# Patient Record
Sex: Male | Born: 1975 | Race: Black or African American | Hispanic: No | Marital: Married | Smoking: Never smoker
Health system: Southern US, Community
[De-identification: ages and names within clinical notes are randomized; demographics above are authoritative.]

## PROBLEM LIST (undated history)

## (undated) DIAGNOSIS — J45909 Unspecified asthma, uncomplicated: Secondary | ICD-10-CM

## (undated) DIAGNOSIS — T7840XA Allergy, unspecified, initial encounter: Secondary | ICD-10-CM

## (undated) DIAGNOSIS — M1990 Unspecified osteoarthritis, unspecified site: Secondary | ICD-10-CM

## (undated) DIAGNOSIS — I10 Essential (primary) hypertension: Secondary | ICD-10-CM

## (undated) HISTORY — DX: Essential (primary) hypertension: I10

## (undated) HISTORY — DX: Unspecified asthma, uncomplicated: J45.909

## (undated) HISTORY — DX: Allergy, unspecified, initial encounter: T78.40XA

## (undated) HISTORY — DX: Unspecified osteoarthritis, unspecified site: M19.90

---

## 2015-02-11 ENCOUNTER — Encounter: Payer: Self-pay | Admitting: Primary Care

## 2015-02-11 ENCOUNTER — Other Ambulatory Visit: Payer: Self-pay | Admitting: Primary Care

## 2015-02-11 ENCOUNTER — Ambulatory Visit (INDEPENDENT_AMBULATORY_CARE_PROVIDER_SITE_OTHER): Payer: BLUE CROSS/BLUE SHIELD | Admitting: Primary Care

## 2015-02-11 VITALS — BP 200/108 | HR 78 | Temp 98.0°F | Ht 74.0 in | Wt 226.8 lb

## 2015-02-11 DIAGNOSIS — I10 Essential (primary) hypertension: Secondary | ICD-10-CM

## 2015-02-11 MED ORDER — LISINOPRIL 5 MG PO TABS: 5.0000 mg | ORAL_TABLET | Freq: Every day | ORAL | Status: DC

## 2015-02-11 MED ORDER — CLONIDINE HCL 0.1 MG PO TABS: ORAL_TABLET | ORAL | Status: DC

## 2015-02-11 MED ORDER — AMLODIPINE BESYLATE 5 MG PO TABS: 5.0000 mg | ORAL_TABLET | Freq: Every day | ORAL | Status: DC

## 2015-02-11 NOTE — Assessment & Plan Note (Addendum)
Uncontrolled.  BP initially today 230/110. After 0.1 mg of clonidine pressure reduced to 200/106 (45 min after medication), then to 200/108 after a second 0.1 mg of clonidine (45 min after medication). He was provided with a third 0.1 mg clonidine tablet upon discharge. I highly recommended he go to the ER immediately due to risk of stroke and other complications of hypertension. He refused. I also spoke with his wife and notified her of my recommendation. Patient still refused treatment in the ER. Refilled medications and will be closely monitoring him. He is to follow up tomorrow for a re-check and then again next week. Renal ultrasound also ordered for further evaluation. Denies chest pain and shortness of breath.

## 2015-02-11 NOTE — Patient Instructions (Addendum)
I highly recommend you go to the Emergency Department now. Start your blood pressure medication today. I have sent refills to your pharmacy.  [facility].  Follow up tomorrow for a nurse visit and next Thursday with me for re-evaluation.   If you develop worsening headaches or chest pain please call me immediately and/or go to the emergency department.  It was a pleasure to meet you today! Please don't hesitate to call me with any questions. Welcome to [facility]!    Hypertension Hypertension, commonly called high blood pressure, is when the force of blood pumping through your arteries is too strong. Your arteries are the blood vessels that carry blood from your heart throughout your body. A blood pressure reading consists of a higher number over a lower number, such as 110/72. The higher number (systolic) is the pressure inside your arteries when your heart pumps. The lower number (diastolic) is the pressure inside your arteries when your heart relaxes. Ideally you want your blood pressure below 120/80. Hypertension forces your heart to work harder to pump blood. Your arteries may become narrow or stiff. Having hypertension puts you at risk for heart disease, stroke, and other problems.  RISK FACTORS Some risk factors for high blood pressure are controllable. Others are not.  Risk factors you cannot control include:   Race. You may be at higher risk if you are African American.  Age. Risk increases with age.  Gender. Men are at higher risk than women before age 37 years. After age 37, women are at higher risk than men. Risk factors you can control include:  Not getting enough exercise or physical activity.  Being overweight.  Getting too much fat, sugar, calories, or salt in your diet.  Drinking too much alcohol. SIGNS AND SYMPTOMS Hypertension does not usually cause signs or symptoms. Extremely high blood pressure (hypertensive  crisis) may cause headache, anxiety, shortness of breath, and nosebleed. DIAGNOSIS  To check if you have hypertension, your health care provider will measure your blood pressure while you are seated, with your arm held at the level of your heart. It should be measured at least twice using the same arm. Certain conditions can cause a difference in blood pressure between your right and left arms. A blood pressure reading that is higher than normal on one occasion does not mean that you need treatment. If one blood pressure reading is high, ask your health care provider about having it checked again. TREATMENT  Treating high blood pressure includes making lifestyle changes and possibly taking medicine. Living a healthy lifestyle can help lower high blood pressure. You may need to change some of your habits. Lifestyle changes may include:  Following the DASH diet. This diet is high in fruits, vegetables, and whole grains. It is low in salt, red meat, and added sugars.  Getting at least 2 hours of brisk physical activity every week.  Losing weight if necessary.  Not smoking.  Limiting alcoholic beverages.  Learning ways to reduce stress. If lifestyle changes are not enough to get your blood pressure under control, your health care provider may prescribe medicine. You may need to take more than one. Work closely with your health care provider to understand the risks and benefits. HOME CARE INSTRUCTIONS  Have your blood pressure rechecked as directed by your health care provider.   Take medicines only as directed by your health care provider. Follow the directions carefully. Blood pressure medicines must be  taken as prescribed. The medicine does not work as well when you skip doses. Skipping doses also puts you at risk for problems.   Do not smoke.   Monitor your blood pressure at home as directed by your health care provider. SEEK MEDICAL CARE IF:   You think you are having a reaction  to medicines taken.  You have recurrent headaches or feel dizzy.  You have swelling in your ankles.  You have trouble with your vision. SEEK IMMEDIATE MEDICAL CARE IF:  You develop a severe headache or confusion.  You have unusual weakness, numbness, or feel faint.  You have severe chest or abdominal pain.  You vomit repeatedly.  You have trouble breathing. MAKE SURE YOU:   Understand these instructions.  Will watch your condition.  Will get help right away if you are not doing well or get worse. Document Released: 07/18/2005 Document Revised: 12/02/2013 Document Reviewed: 05/10/2013 [facility] Patient Information 2015 [place], [place]. This information is not intended to replace advice given to you by your health care provider. Make sure you discuss any questions you have with your health care provider.

## 2015-02-11 NOTE — Progress Notes (Signed)
Subjective:    Patient ID: Maurice Henderson, male    DOB: 03/02/1976, 39 y.o.   MRN: [id number]  HPI  Maurice Henderson is a 39 year old male who presents today to establish care and discuss the problems mentioned below. Will obtain old records. He recently moved here from [place].   1) Hypertension: Diagnosed in January of 2016 when complaining of headaches. He was managed on amlodipine 5 mg and lisinopril 5 mg prior and has been out of his medication for one month since moving from [place]. When on these medications his blood pressure would run 110/80. He reports headaches, blurred vision over the past several weeks. He was evaluated at an optometrist yesterday. Denies chest pain, shortness of breath, and reports to be feeling well overall.  Review of Systems  Constitutional: Negative for unexpected weight change.  HENT: Negative for rhinorrhea.   Respiratory: Negative for cough and shortness of breath.   Cardiovascular: Negative for chest pain.  Gastrointestinal: Negative for diarrhea and constipation.  Genitourinary: Negative for difficulty urinating.  Musculoskeletal: Negative for myalgias and arthralgias.  Skin: Negative for rash.  Neurological: Positive for headaches. Negative for dizziness and numbness.  Psychiatric/Behavioral:       Denies concerns for anxiety or depression       Past Medical History  Diagnosis Date  . Allergy   . Arthritis   . Hypertension   . Asthma     during childhood    History   Social History  . Marital Status: Married    Spouse Name: N/A  . Number of Children: N/A  . Years of Education: N/A   Occupational History  . Not on file.   Social History Main Topics  . Smoking status: Never Smoker   . Smokeless tobacco: Not on file  . Alcohol Use: 0.0 oz/week    0 Standard drinks or equivalent per week     Comment: rarely  . Drug Use: Not on file  . Sexual Activity: Not on file   Other Topics Concern  . Not on file   Social History  Narrative   In a relationship.   3 children.   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   Moved to [place] from [place].   Enjoys going to the shooting range, paintball.     History reviewed. No pertinent past surgical history.  Family History  Problem Relation Age of Onset  . Arthritis Mother   . Arthritis Father   . Hyperlipidemia Father   . Hypertension Father   . Heart disease Father     No Known Allergies  No current outpatient prescriptions on file prior to visit.   No current facility-administered medications on file prior to visit.    BP 200/108 mmHg  Pulse 78  Temp(Src) 98 F (36.7 C) (Oral)  Ht 6\' 2"  (1.88 m)  Wt 226 lb 12.8 oz (102.876 kg)  BMI 29.11 kg/m2  SpO2 98%    Objective:   Physical Exam  Constitutional: He is oriented to person, place, and time. He appears well-nourished.  Eyes: Pupils are equal, round, and reactive to light.  Cardiovascular: Normal rate and regular rhythm.   Pulmonary/Chest: Effort normal and breath sounds normal.  Musculoskeletal: Normal range of motion.  Neurological: He is alert and oriented to person, place, and time.  Skin: Skin is warm and dry.  Psychiatric: He has a normal mood and affect.          Assessment & Plan:  Patient spent  over 2.5 hours in our office today due to treatment of extreme hypertension.

## 2015-02-11 NOTE — Progress Notes (Signed)
Pre visit review using our clinic review tool, if applicable. No additional management support is needed unless otherwise documented below in the visit note. 

## 2015-02-12 ENCOUNTER — Emergency Department: Payer: BLUE CROSS/BLUE SHIELD

## 2015-02-12 ENCOUNTER — Ambulatory Visit: Payer: BLUE CROSS/BLUE SHIELD

## 2015-02-12 ENCOUNTER — Emergency Department
Admission: EM | Admit: 2015-02-12 | Discharge: 2015-02-12 | Disposition: A | Discharge status: Home / Self Care | Payer: BLUE CROSS/BLUE SHIELD | Attending: Emergency Medicine | Admitting: Emergency Medicine

## 2015-02-12 ENCOUNTER — Other Ambulatory Visit: Payer: Self-pay

## 2015-02-12 DIAGNOSIS — R51 Headache: Secondary | ICD-10-CM | POA: Diagnosis not present

## 2015-02-12 DIAGNOSIS — R531 Weakness: Secondary | ICD-10-CM | POA: Diagnosis not present

## 2015-02-12 DIAGNOSIS — I10 Essential (primary) hypertension: Secondary | ICD-10-CM | POA: Insufficient documentation

## 2015-02-12 LAB — BASIC METABOLIC PANEL
Anion gap: 7 (ref 5–15)
BUN: 19 mg/dL (ref 6–20)
CO2: 25 mmol/L (ref 22–32)
Calcium: 9.4 mg/dL (ref 8.9–10.3)
Chloride: 103 mmol/L (ref 101–111)
Creatinine, Ser: 1.32 mg/dL — ABNORMAL HIGH (ref 0.61–1.24)
GFR calc Af Amer: >60 mL/min (ref >60)
GFR calc non Af Amer: >60 mL/min (ref >60)
Glucose, Bld: 145 mg/dL — ABNORMAL HIGH (ref 65–99)
POTASSIUM: 3.5 mmol/L (ref 3.5–5.1)
SODIUM: 135 mmol/L (ref 135–145)

## 2015-02-12 LAB — CBC
HEMATOCRIT: 41.6 % (ref 40.0–52.0)
Hemoglobin: 14.3 g/dL (ref 13.0–18.0)
MCH: 29.8 pg (ref 26.0–34.0)
MCHC: 34.4 g/dL (ref 32.0–36.0)
MCV: 86.7 fL (ref 80.0–100.0)
Platelets: 140 10*3/uL — ABNORMAL LOW (ref 150–440)
RBC: 4.8 MIL/uL (ref 4.40–5.90)
RDW: 12.8 % (ref 11.5–14.5)
WBC: 7.7 10*3/uL (ref 3.8–10.6)

## 2015-02-12 LAB — BRAIN NATRIURETIC PEPTIDE: B NATRIURETIC PEPTIDE 5: 54 pg/mL (ref 0.0–100.0)

## 2015-02-12 LAB — TROPONIN I

## 2015-02-12 NOTE — ED Provider Notes (Signed)
[facility] Emergency Department Provider Note     Time seen: ----------------------------------------- 2:16 PM on 02/12/2015 -----------------------------------------    I have reviewed the triage vital signs and the nursing notes.   HISTORY  Chief Complaint Hypertension    HPI Maurice Henderson is a 39 y.o. male who presents ER for high blood pressure. Patient states he was at work and was feeling a little weak and tired and saw his work Engineer, civil (consulting) and they checked his blood pressure it was markedly elevated. Patient just saw his primary care doctor yesterday to establish care and was placed on clonidine amlodipine and lisinopril. Patient states yesterday when he went to see the doctorhe was feeling fine. Currently feels fine, blood pressure noted to be elevated.   Past Medical History  Diagnosis Date  . Allergy   . Arthritis   . Hypertension   . Asthma     during childhood    Patient Active Problem List   Diagnosis Date Noted  . Essential hypertension 02/11/2015    No past surgical history on file.  Allergies Review of patient's allergies indicates no known allergies.  Social History History  Substance Use Topics  . Smoking status: Never Smoker   . Smokeless tobacco: Not on file  . Alcohol Use: 0.0 oz/week    0 Standard drinks or equivalent per week     Comment: rarely    Review of Systems Constitutional: Negative for fever. Eyes: Negative for visual changes. ENT: Negative for sore throat. Cardiovascular: Negative for chest pain. Respiratory: Negative for shortness of breath. Gastrointestinal: Negative for abdominal pain, vomiting and diarrhea. Genitourinary: Negative for dysuria. Musculoskeletal: Negative for back pain. Skin: Negative for rash. Neurological: Positive for weakness and headache that is gone now..  10-point ROS otherwise negative.  ____________________________________________   PHYSICAL EXAM:  VITAL  SIGNS: ED Triage Vitals  Enc Vitals Group     BP 02/12/15 1309 208/130 mmHg     Pulse Rate 02/12/15 1309 112     Resp --      Temp 02/12/15 1309 99 F (37.2 C)     Temp Source 02/12/15 1309 Oral     SpO2 02/12/15 1309 97 %     Weight 02/12/15 1309 226 lb (102.513 kg)     Height 02/12/15 1309  (1.88 m)     Head Cir --      Peak Flow --      Pain Score --      Pain Loc --      Pain Edu? --      Excl. in GC? --     Constitutional: Alert and oriented. Well appearing and in no distress. Eyes: Conjunctivae are normal. PERRL. Normal extraocular movements. ENT   Head: Normocephalic and atraumatic.   Nose: No congestion/rhinnorhea.   Mouth/Throat: Mucous membranes are moist.   Neck: No stridor. Hematological/Lymphatic/Immunilogical: No cervical lymphadenopathy. Cardiovascular: Normal rate, regular rhythm. Normal and symmetric distal pulses are present in all extremities. No murmurs, rubs, or gallops. Respiratory: Normal respiratory effort without tachypnea nor retractions. Breath sounds are clear and equal bilaterally. No wheezes/rales/rhonchi. Gastrointestinal: Soft and nontender. No distention. No abdominal bruits. There is no CVA tenderness. Musculoskeletal: Nontender with normal range of motion in all extremities. No joint effusions.  No lower extremity tenderness nor edema. Neurologic:  Normal speech and language. No gross focal neurologic deficits are appreciated. Speech is normal. No gait instability. Skin:  Skin is warm, dry and intact. No rash noted. Psychiatric: Mood and  affect are normal. Speech and behavior are normal. Patient exhibits appropriate insight and judgment. ____________________________________________  EKG: Interpreted by me. Normal sinus rhythm with a rate of 92, minimal voltage criteria for LVH, T-wave abnormalities, no evidence of acute infarction. QT interval is normal  ____________________________________________  ED COURSE:  Pertinent  labs & imaging results that were available during my care of the patient were reviewed by me and considered in my medical decision making (see chart for details). We'll check basic labs, reevaluate. Essentially patient with asymptomatic blood pressure ____________________________________________    LABS (pertinent positives/negatives)  Labs Reviewed  BASIC METABOLIC PANEL - Abnormal; Notable for the following:    Glucose, Bld 145 (*)    Creatinine, Ser 1.32 (*)    All other components within normal limits  CBC - Abnormal; Notable for the following:    Platelets 140 (*)    All other components within normal limits  TROPONIN I  BRAIN NATRIURETIC PEPTIDE    RADIOLOGY Images were viewed by me  Chest x-ray is unremarkable  ____________________________________________  FINAL ASSESSMENT AND PLAN  Hypertension  Plan: Patient likely was feeling poorly due to being started on 2 new blood pressure medications at the same time. He appears to be in no acute distress, labs reveal a slightly elevated creatinine which he can follow-up with his doctor for further reevaluation and recheck.   Emily FilbertWilliams, Jonathan E, MD   Emily FilbertJonathan E Williams, MD 02/12/15 [phone]

## 2015-02-12 NOTE — Discharge Instructions (Signed)

## 2015-02-12 NOTE — ED Notes (Signed)
Patient transported to X-ray 

## 2015-02-12 NOTE — ED Notes (Signed)
Pt arrives with HTN, pt states he was at work feeling a little "funky" and saw the work Engineer, civil (consulting)nurse, pt saw PCP yesterday and was restarted on his HTN medication, pt states headache

## 2015-02-19 ENCOUNTER — Encounter: Payer: Self-pay | Admitting: Primary Care

## 2015-02-19 ENCOUNTER — Ambulatory Visit (INDEPENDENT_AMBULATORY_CARE_PROVIDER_SITE_OTHER): Payer: BLUE CROSS/BLUE SHIELD | Admitting: Primary Care

## 2015-02-19 VITALS — BP 176/104 | HR 101 | Temp 98.0°F | Ht 74.0 in | Wt 225.1 lb

## 2015-02-19 DIAGNOSIS — E1165 Type 2 diabetes mellitus with hyperglycemia: Secondary | ICD-10-CM | POA: Insufficient documentation

## 2015-02-19 DIAGNOSIS — I10 Essential (primary) hypertension: Secondary | ICD-10-CM | POA: Diagnosis not present

## 2015-02-19 DIAGNOSIS — R0981 Nasal congestion: Secondary | ICD-10-CM | POA: Diagnosis not present

## 2015-02-19 DIAGNOSIS — R739 Hyperglycemia, unspecified: Secondary | ICD-10-CM | POA: Diagnosis not present

## 2015-02-19 DIAGNOSIS — E119 Type 2 diabetes mellitus without complications: Secondary | ICD-10-CM | POA: Insufficient documentation

## 2015-02-19 DIAGNOSIS — M542 Cervicalgia: Secondary | ICD-10-CM | POA: Diagnosis not present

## 2015-02-19 LAB — BASIC METABOLIC PANEL
BUN: 18 mg/dL (ref 6–23)
CHLORIDE: 101 meq/L (ref 96–112)
CO2: 31 meq/L (ref 19–32)
CREATININE: 1.47 mg/dL (ref 0.40–1.50)
Calcium: 9.8 mg/dL (ref 8.4–10.5)
GFR: 69.43 mL/min (ref >60.00)
Glucose, Bld: 107 mg/dL — ABNORMAL HIGH (ref 70–99)
POTASSIUM: 4.1 meq/L (ref 3.5–5.1)
SODIUM: 136 meq/L (ref 135–145)

## 2015-02-19 LAB — HEMOGLOBIN A1C: Hgb A1c MFr Bld: 5.3 % (ref 4.6–6.5)

## 2015-02-19 MED ORDER — FLUTICASONE PROPIONATE 50 MCG/ACT NA SUSP: 2.0000 | Freq: Every day | NASAL | Status: DC

## 2015-02-19 NOTE — Assessment & Plan Note (Signed)
Present for past several months. 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 Referral made to chiropractor per patient request. Stop ibuprofen use due to HTN and recent elevated creatine

## 2015-02-19 NOTE — Assessment & Plan Note (Signed)
Evaluated in ED on 7/14 and discharged home for follow up. Creatinine in ED elevated, will repeat today. Also glucose elevated, A1C today. BP overall reducing. Will continue to monitor. He is to purchase a cuff and measure daily. Headaches continue but are improved. Renal ultrasound tomorrow. Continue current meds. No changes in meds today as I do not want to bring his pressure down too quickly.  Follow up in 3 weeks. Will closely monitor.

## 2015-02-19 NOTE — Progress Notes (Signed)
Pre visit review using our clinic review tool, if applicable. No additional management support is needed unless otherwise documented below in the visit note. 

## 2015-02-19 NOTE — Progress Notes (Signed)
Subjective:    Patient ID: Maurice Henderson, male    DOB: 1976/05/27, 39 y.o.   MRN: [id number]  HPI  Maurice Henderson is a 39 year old male who presents today for follow up of hypertension. He was evaluated on 02/11/2015 as a new patient and was noted to have a blood pressure of 230/110 which decreaesd to 200/106 after a total of 0.3 mg of Clonodine. He had been out of his BP medications for one month since moving from [place]. He was taking Amlodipine  and Lisinopril  which was re-ordered.   He presented to the emergency department on 02/12/2015 due to feeling "off" and was evaluated. The EDP determined him to be stable for discharge and advised him to follow up as scheduled. Chest xray was unremarkable, BMP showed an elevated creatinine and glucose of 145.   Since last week his blood pressure has decreased. Today in the office he is running 176/104. Overall he's feeling better. He's been taking ibuprofen 600 mg BID consistently for the past several months for neck pain and stiffness.  2) Neck pain:Stiffness after work nearly everyday. 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 His stiffness has been present for the past several months and is not resolving. He's tried tiger balm and ibuprofen with some relief. He would like to be evaluated by a chiropractor and is requesting referral.  3) Nasal congestion: Present for the past 2 days with some sinus pressure. He endorses a history of sinusitis. Denies fevers, chills, fatigue. He's not taken anything OTC for his symptoms. He does working in a [employer] daily.  Review of Systems  HENT: Positive for congestion and sinus pressure. Negative for ear pain, rhinorrhea and sore throat.   Respiratory: Negative for shortness of breath.   Cardiovascular: Negative for chest pain.  Gastrointestinal: Negative for nausea.  Musculoskeletal: Negative for myalgias.    Neurological: Positive for headaches.       Past Medical History  Diagnosis Date  . Allergy   . Arthritis   . Hypertension   . Asthma     during childhood    History   Social History  . Marital Status: Married    Spouse Name: N/A  . Number of Children: N/A  . Years of Education: N/A   Occupational History  . Not on file.   Social History Main Topics  . Smoking status: Never Smoker   . Smokeless tobacco: Not on file  . Alcohol Use: 0.0 oz/week    0 Standard drinks or equivalent per week     Comment: rarely  . Drug Use: Not on file  . Sexual Activity: Not on file   Other Topics Concern  . Not on file   Social History Narrative   In a relationship.   3 children.   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   Moved to [place] from [place].   Enjoys going to the shooting range, paintball.     No past surgical history on file.  Family History  Problem Relation Age of Onset  . Arthritis Mother   . Arthritis Father   . Hyperlipidemia Father   . Hypertension Father   . Heart disease Father     No Known Allergies  Current Outpatient Prescriptions on File Prior to Visit  Medication Sig Dispense Refill  . amLODipine (NORVASC) 5 MG tablet Take 1 tablet (5 mg total) by mouth daily. 30  tablet 3  . lisinopril (PRINIVIL,ZESTRIL) 5 MG tablet Take 1 tablet (5 mg total) by mouth daily. 30 tablet 3   No current facility-administered medications on file prior to visit.    BP 176/104 mmHg  Pulse 101  Temp(Src) 98 F (36.7 C) (Oral)  Ht 6\' 2"  (1.88 m)  Wt 225 lb 1.9 oz (102.114 kg)  BMI 28.89 kg/m2  SpO2 98%    Objective:   Physical Exam  Constitutional: He appears well-nourished. He does not appear ill.  HENT:  Right Ear: Tympanic membrane and ear canal normal.  Left Ear: Tympanic membrane and ear canal normal.  Nose: Right sinus exhibits maxillary sinus tenderness. Right sinus exhibits no frontal sinus tenderness. Left sinus exhibits maxillary sinus  tenderness. Left sinus exhibits no frontal sinus tenderness.  Mouth/Throat: Oropharynx is clear and moist.  Cardiovascular: Normal rate and regular rhythm.   Pulmonary/Chest: Effort normal and breath sounds normal.  Skin: Skin is warm and dry.          Assessment & Plan:  Nasal congestion:  Present for 2 days. He's tried no OTC meds. Congestion upon exam, no cough. Mild tenderness to maxillary sinuses. RX for flonase and recommendations for daily antihistamine. Follow up PRN.

## 2015-02-19 NOTE — Assessment & Plan Note (Signed)
Prior records mention A1C of 7.5 from February 2016. He reports is not being treated. Glucose elevated at 145 in ED on 7/14. A1C today. Will initiate medication if necessary.

## 2015-02-19 NOTE — Patient Instructions (Signed)
Continue taking your blood pressure medications. Obtain an blood pressure monitor and check your blood pressure once daily around the same time after you've rested for 20 min.  If your blood pressure remains elevated without reducing over the next 2 weeks, please call me.  Complete lab work prior to leaving today. I will notify you of your results.  You will be contacted regarding your referral to the chiropractor.  Please let us know if you have not heard back within one week.   You may use the Flonase nasal spray once daily for nasal congestion. 2 sprays in each nostril once daily. You may also want to consider a daily antihistamine such as Claritin, Zyrtec, or Allegra to help with congestion.  Follow up in 3 weeks for re-check of your blood pressure.  It was nice to see you!

## 2015-02-20 ENCOUNTER — Ambulatory Visit (INDEPENDENT_AMBULATORY_CARE_PROVIDER_SITE_OTHER): Payer: BLUE CROSS/BLUE SHIELD

## 2015-02-20 DIAGNOSIS — I10 Essential (primary) hypertension: Secondary | ICD-10-CM

## 2015-02-23 ENCOUNTER — Encounter: Payer: Self-pay | Admitting: *Deleted

## 2015-02-24 ENCOUNTER — Encounter: Payer: Self-pay | Admitting: *Deleted

## 2015-06-20 ENCOUNTER — Other Ambulatory Visit: Payer: Self-pay | Admitting: Primary Care

## 2015-06-20 DIAGNOSIS — I10 Essential (primary) hypertension: Secondary | ICD-10-CM

## 2015-06-20 NOTE — Telephone Encounter (Signed)
Electronically refill request for   amlodipine (NORVASC) 5 MG tablet   Take 1 tablet (5 mg total) by mouth daily.  Dispense: 30 tablet   Refills: 3     Last prescribed on 02/11/2015. Last seen on 02/19/2015. No future appointment.

## 2015-06-22 MED ORDER — LISINOPRIL 5 MG PO TABS: 5.0000 mg | ORAL_TABLET | Freq: Every day | ORAL | Status: DC

## 2015-06-22 NOTE — Telephone Encounter (Signed)
Sending a letter for patient to make a follow up for BP at his convenience.

## 2015-06-22 NOTE — Addendum Note (Signed)
Addended by: Tawnya CrookSAMBATH, Lyrick Worland on: 06/22/2015 12:02 PM   Modules accepted: Orders

## 2015-07-18 ENCOUNTER — Other Ambulatory Visit: Payer: Self-pay | Admitting: Primary Care

## 2015-07-20 NOTE — Telephone Encounter (Signed)
Message left for patient to return my call.  

## 2015-07-20 NOTE — Telephone Encounter (Signed)
Electronically refill request for   lisinopril (PRINIVIL,ZESTRIL) 5 MG tablet   Take 1 tablet (5 mg total) by mouth daily. **Need office visit for further refills**  Dispense: 30 tablet   Refills: 0     Last prescribed on 06/22/2015. Last seen on 02/19/2015. No future appointment.

## 2015-07-24 NOTE — Telephone Encounter (Signed)
Message left for patient to return my call.  

## 2015-08-19 ENCOUNTER — Telehealth: Payer: Self-pay | Admitting: Primary Care

## 2015-08-19 NOTE — Telephone Encounter (Signed)
I received results from an eye examination Maurice Henderson completed on 08/17/15 with evidence for retinal microaneurysm with rupture. Per this note he has been off of his BP meds and was hypertensive in the office. Will you please schedule him for BP follow up? Thanks.

## 2015-08-20 NOTE — Telephone Encounter (Signed)
Message left for patient to return my call.  

## 2015-08-24 NOTE — Telephone Encounter (Signed)
Left a voicemail on Friday 1/20 and 1/23 to return call.

## 2015-08-25 ENCOUNTER — Encounter: Payer: Self-pay | Admitting: *Deleted

## 2015-09-01 ENCOUNTER — Other Ambulatory Visit: Payer: Self-pay | Admitting: Primary Care

## 2015-09-01 DIAGNOSIS — I10 Essential (primary) hypertension: Secondary | ICD-10-CM

## 2015-09-01 NOTE — Telephone Encounter (Signed)
Message left on patient's number for patient to return my call. Message left on patient's wife number for patient to return my call.

## 2015-09-01 NOTE — Telephone Encounter (Signed)
Electronically refill request for   lisinopril (PRINIVIL,ZESTRIL) 5 MG tablet   TAKE 1 TABLET (5 MG TOTAL) BY MOUTH DAILY. **NEED OFFICE VISIT FOR FURTHER REFILLS**  Dispense: 30 tablet   Refills: 0    Last prescribed on 07/20/2015.       amLODipine (NORVASC) 5 MG tablet   TAKE 1 TABLET (5 MG TOTAL) BY MOUTH DAILY.  Dispense: 30 tablet   Refills: 1    Last prescribed on 06/21/2015.

## 2015-09-03 NOTE — Telephone Encounter (Signed)
Message left for patient to return my call.  Sending letter with results and [name]'s comments for patient.  

## 2015-10-08 ENCOUNTER — Ambulatory Visit (INDEPENDENT_AMBULATORY_CARE_PROVIDER_SITE_OTHER): Payer: BLUE CROSS/BLUE SHIELD | Admitting: Primary Care

## 2015-10-08 ENCOUNTER — Encounter: Payer: Self-pay | Admitting: Primary Care

## 2015-10-08 VITALS — BP 172/114 | HR 82 | Temp 98.1°F | Ht 74.0 in | Wt 229.8 lb

## 2015-10-08 DIAGNOSIS — J3489 Other specified disorders of nose and nasal sinuses: Secondary | ICD-10-CM | POA: Diagnosis not present

## 2015-10-08 DIAGNOSIS — I10 Essential (primary) hypertension: Secondary | ICD-10-CM

## 2015-10-08 DIAGNOSIS — J309 Allergic rhinitis, unspecified: Secondary | ICD-10-CM | POA: Insufficient documentation

## 2015-10-08 LAB — BASIC METABOLIC PANEL
BUN: 18 mg/dL (ref 6–23)
CALCIUM: 9.6 mg/dL (ref 8.4–10.5)
CO2: 28 meq/L (ref 19–32)
CREATININE: 1.25 mg/dL (ref 0.40–1.50)
Chloride: 104 mEq/L (ref 96–112)
GFR: 83.42 mL/min (ref >60.00)
Glucose, Bld: 107 mg/dL — ABNORMAL HIGH (ref 70–99)
Potassium: 4.5 mEq/L (ref 3.5–5.1)
Sodium: 138 mEq/L (ref 135–145)

## 2015-10-08 MED ORDER — MONTELUKAST SODIUM 10 MG PO TABS: 10.0000 mg | ORAL_TABLET | Freq: Every day | ORAL | Status: DC

## 2015-10-08 MED ORDER — AMLODIPINE BESY-BENAZEPRIL HCL 10-20 MG PO CAPS
1.0000 | ORAL_CAPSULE | Freq: Every day | ORAL | Status: DC
Start: 2015-10-08 — End: 2015-10-29

## 2015-10-08 NOTE — Patient Instructions (Addendum)
Start Singulair 10 mg tablets for sinus pressure/nasal congestion. Take 1 tablet by mouth every night at bedtime.  Start amlodipine-benazepril 10/20 mg. Take 1 tablet by mouth every morning.  Stop taking amlodipine 5 mg and lisinopril 5 mg.  Complete lab work prior to leaving today. I will notify you of your results once received.   Check your blood pressure daily, around the same time of day, for the next 2 weeks.   Ensure that you have rested for 30 minutes prior to checking your blood pressure. Record your readings and call me in 2 weeks with results.  Follow up in 2 weeks for re-evaluation of blood pressure  It was a pleasure to see you today!

## 2015-10-08 NOTE — Assessment & Plan Note (Signed)
Chronic with nasal congestion x years. No improvement with OTC antihistamines, some improvement with Flonase. Will start Singulair and continue Flonase. Offered ENT referral, he declines at this time.

## 2015-10-08 NOTE — Progress Notes (Signed)
Pre visit review using our clinic review tool, if applicable. No additional management support is needed unless otherwise documented below in the visit note. 

## 2015-10-08 NOTE — Progress Notes (Signed)
Subjective:    Patient ID: Maurice Henderson, male    DOB: 18-May-1976, 40 y.o.   MRN: [id number]  HPI  Maurice Henderson is a 40 year old male who presents today for follow up of hypertension. He was evaluated as a new patient in July 2016 with BP noted to be 170's/90's in the clinic and 200's/100's from a recent ED visit several days prior. He had a prior history of HTN and had not has his medication for nearly one month at that point.   He was placed back on his meds last visit which include Amlodipine 5 mg and Lisinopril 5 mg. He was encouraged to follow up in our office for re-check and had not done so. His creatinine last visit was borderline. 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   His BP is elevated in the clinic today at 172/114. He endorses compliance to his BP medications. He will notice headaches if he's non compliant with his medication. He's been working to improve his diet and is active at work.   2) Sinus Pressure: Present for numerous years. He's currently using Flonase nasal spray and hasn't noticed improvement. He endorses using Zyrtec in the past without improvement. He typically cannot blow mucous from his nasal cavity. 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  Denies fevers, feeling sick, nausea, sore throat, etc.   Review of Systems  Constitutional: Negative for fever and fatigue.  HENT: Positive for sinus pressure and sneezing.   Eyes: Negative for itching.  Respiratory: Negative for shortness of breath.   Cardiovascular: Negative for chest pain.  Neurological: Positive for headaches.       Past Medical History  Diagnosis Date  . Allergy   . Arthritis   . Hypertension   . Asthma     during childhood    Social History   Social History  . Marital Status: Married    Spouse Name: N/A  . Number of Children: N/A  .  Years of Education: N/A   Occupational History  . Not on file.   Social History Main Topics  . Smoking status: Never Smoker   . Smokeless tobacco: Not on file  . Alcohol Use: 0.0 oz/week    0 Standard drinks or equivalent per week     Comment: rarely  . Drug Use: Not on file  . Sexual Activity: Not on file   Other Topics Concern  . Not on file   Social History Narrative   In a relationship.   3 children.   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   Moved to [place] from [place].   Enjoys going to the shooting range, paintball.     No past surgical history on file.  Family History  Problem Relation Age of Onset  . Arthritis Mother   . Arthritis Father   . Hyperlipidemia Father   . Hypertension Father   . Heart disease Father     No Known Allergies  Current Outpatient Prescriptions on File Prior to Visit  Medication Sig Dispense Refill  . fluticasone (FLONASE) 50 MCG/ACT nasal spray Place 2 sprays into both nostrils daily. 16 g 0   No current facility-administered medications on file prior to visit.    BP 172/114 mmHg  Pulse 82  Temp(Src) 98.1 F (36.7 C) (Oral)  Ht  (1.88 m)  Wt 229 lb 12.8  oz (104.237 kg)  BMI 29.49 kg/m2  SpO2 98%    Objective:   Physical Exam  Constitutional: He appears well-nourished.  HENT:  Right Ear: Tympanic membrane and ear canal normal.  Left Ear: Tympanic membrane and ear canal normal.  Nose: Mucosal edema present. Right sinus exhibits no maxillary sinus tenderness and no frontal sinus tenderness. Left sinus exhibits no maxillary sinus tenderness and no frontal sinus tenderness.  Mouth/Throat: Oropharynx is clear and moist.  Neck: Neck supple.  Cardiovascular: Normal rate and regular rhythm.   Pulmonary/Chest: Effort normal and breath sounds normal.  Skin: Skin is warm and dry.          Assessment & Plan:

## 2015-10-08 NOTE — Assessment & Plan Note (Signed)
Uncontrolled. Never followed up as recommended. Stop lisinopril 5 mg. Stop amlodipine 5 mg. Start Lotrel 10/20.  Discussed to check BP at work with the nurse. Also discussed importance of follow up to ensure BP levels are managed. Follow up in 2 weeks. BMP pending today.

## 2015-10-09 ENCOUNTER — Encounter: Payer: Self-pay | Admitting: *Deleted

## 2015-10-20 ENCOUNTER — Telehealth: Payer: Self-pay | Admitting: Primary Care

## 2015-10-20 ENCOUNTER — Ambulatory Visit: Payer: BLUE CROSS/BLUE SHIELD | Admitting: Primary Care

## 2015-10-20 NOTE — Telephone Encounter (Signed)
Yes, please schedule patient for follow up this week or next. Please notify him that I have chosen to waive the no show fee this time.

## 2015-10-20 NOTE — Telephone Encounter (Signed)
Patient did not come for their scheduled appointment today for 2 week follow up.  Please let me know if the patient needs to be contacted immediately for follow up or if no follow up is necessary.   ° °

## 2015-10-21 NOTE — Telephone Encounter (Signed)
Appointment 3/30 Pt aware

## 2015-10-29 ENCOUNTER — Encounter: Payer: Self-pay | Admitting: Primary Care

## 2015-10-29 ENCOUNTER — Ambulatory Visit (INDEPENDENT_AMBULATORY_CARE_PROVIDER_SITE_OTHER): Payer: BLUE CROSS/BLUE SHIELD | Admitting: Primary Care

## 2015-10-29 VITALS — BP 150/78 | HR 77 | Temp 97.7°F | Ht 74.0 in | Wt 227.1 lb

## 2015-10-29 DIAGNOSIS — R21 Rash and other nonspecific skin eruption: Secondary | ICD-10-CM | POA: Insufficient documentation

## 2015-10-29 DIAGNOSIS — I10 Essential (primary) hypertension: Secondary | ICD-10-CM | POA: Diagnosis not present

## 2015-10-29 DIAGNOSIS — J309 Allergic rhinitis, unspecified: Secondary | ICD-10-CM | POA: Diagnosis not present

## 2015-10-29 DIAGNOSIS — R0981 Nasal congestion: Secondary | ICD-10-CM

## 2015-10-29 MED ORDER — AMLODIPINE BESY-BENAZEPRIL HCL 10-40 MG PO CAPS: 1.0000 | ORAL_CAPSULE | Freq: Every day | ORAL | Status: DC

## 2015-10-29 MED ORDER — LEVOCETIRIZINE DIHYDROCHLORIDE 5 MG PO TABS: 5.0000 mg | ORAL_TABLET | Freq: Every evening | ORAL | Status: DC

## 2015-10-29 NOTE — Assessment & Plan Note (Signed)
Located to posterior chest wall, appears to be acne related. Will have him trial benzoyl peroxide wash for now. Follow up in 1 month.

## 2015-10-29 NOTE — Patient Instructions (Addendum)
Stop taking amlodipine-benazepril 10/20 mg.   Start taking amlodipine-benazepril 10/40 mg. This is an increase from your previous dose as your blood pressure is too high. Start taking this medication every night at bedtime to prevent feeling sluggish.  Stop Singulair as it is not effective.  Start Levocetirizine 5 mg for nasal congestion and allergies. Take 1 tablet by mouth at bedtime.  Check your blood pressure daily, around the same time of day, for the next 2 weeks.   Ensure that you have rested for 30 minutes prior to checking your blood pressure. Record your readings and call me in 2 weeks.  Rash/Bumps: Start using Benzoyl Peroxide Body wash daily when in the shower. This may be purchased over the counter. Please notify me if no improvement.  Follow up in 1 month for re-evaluation of blood pressure and labs.  It was a pleasure to see you today!  DASH Eating Plan DASH stands for "Dietary Approaches to Stop Hypertension." The DASH eating plan is a healthy eating plan that has been shown to reduce high blood pressure (hypertension). Additional health benefits may include reducing the risk of type 2 diabetes mellitus, heart disease, and stroke. The DASH eating plan may also help with weight loss. WHAT DO I NEED TO KNOW ABOUT THE DASH EATING PLAN? For the DASH eating plan, you will follow these general guidelines:  Choose foods with a percent daily value for sodium of less than 5% (as listed on the food label).  Use salt-free seasonings or herbs instead of table salt or sea salt.  Check with your health care provider or pharmacist before using salt substitutes.  Eat lower-sodium products, often labeled as "lower sodium" or "no salt added."  Eat fresh foods.  Eat more vegetables, fruits, and low-fat dairy products.  Choose whole grains. Look for the word "whole" as the first word in the ingredient list.  Choose fish and skinless chicken or Malaw[place]urkey more often than red meat. Limit  fish, poultry, and meat to 6 oz (170 g) each day.  Limit sweets, desserts, sugars, and sugary drinks.  Choose heart-healthy fats.  Limit cheese to 1 oz (28 g) per day.  Eat more home-cooked food and less restaurant, buffet, and fast food.  Limit fried foods.  Cook foods using methods other than frying.  Limit canned vegetables. If you do use them, rinse them well to decrease the sodium.  When eating at a restaurant, ask that your food be prepared with less salt, or no salt if possible. WHAT FOODS CAN I EAT? Seek help from a dietitian for individual calorie needs. Grains Whole grain or whole wheat bread. Brown rice. Whole grain or whole wheat pasta. Quinoa, bulgur, and whole grain cereals. Low-sodium cereals. Corn or whole wheat flour tortillas. Whole grain cornbread. Whole grain crackers. Low-sodium crackers. Vegetables Fresh or frozen vegetables (raw, steamed, roasted, or grilled). Low-sodium or reduced-sodium tomato and vegetable juices. Low-sodium or reduced-sodium tomato sauce and paste. Low-sodium or reduced-sodium canned vegetables.  Fruits All fresh, canned (in natural juice), or frozen fruits. Meat and Other Protein Products Ground beef (85% or leaner), grass-fed beef, or beef trimmed of fat. Skinless chicken or [place]key. Ground chicken or [place]key. Pork trimmed of fat. All fish and seafood. Eggs. Dried beans, peas, or lentils. Unsalted nuts and seeds. Unsalted canned beans. Dairy Low-fat dairy products, such as skim or 1% milk, 2% or reduced-fat cheeses, low-fat ricotta or cottage cheese, or plain low-fat yogurt. Low-sodium or reduced-sodium cheeses. Fats and Oils Tub margarines  without trans fats. Light or reduced-fat mayonnaise and salad dressings (reduced sodium). Avocado. Safflower, olive, or canola oils. Natural peanut or almond butter. Other Unsalted popcorn and pretzels. The items listed above may not be a complete list of recommended foods or beverages. Contact your  dietitian for more options. WHAT FOODS ARE NOT RECOMMENDED? Grains White bread. White pasta. White rice. Refined cornbread. Bagels and croissants. Crackers that contain trans fat. Vegetables Creamed or fried vegetables. Vegetables in a cheese sauce. Regular canned vegetables. Regular canned tomato sauce and paste. Regular tomato and vegetable juices. Fruits Dried fruits. Canned fruit in light or heavy syrup. Fruit juice. Meat and Other Protein Products Fatty cuts of meat. Ribs, chicken wings, bacon, sausage, bologna, salami, chitterlings, fatback, hot dogs, bratwurst, and packaged luncheon meats. Salted nuts and seeds. Canned beans with salt. Dairy Whole or 2% milk, cream, half-and-half, and cream cheese. Whole-fat or sweetened yogurt. Full-fat cheeses or blue cheese. Nondairy creamers and whipped toppings. Processed cheese, cheese spreads, or cheese curds. Condiments Onion and garlic salt, seasoned salt, table salt, and sea salt. Canned and packaged gravies. Worcestershire sauce. Tartar sauce. Barbecue sauce. Teriyaki sauce. Soy sauce, including reduced sodium. Steak sauce. Fish sauce. Oyster sauce. Cocktail sauce. Horseradish. Ketchup and mustard. Meat flavorings and tenderizers. Bouillon cubes. Hot sauce. Tabasco sauce. Marinades. Taco seasonings. Relishes. Fats and Oils Butter, stick margarine, lard, shortening, ghee, and bacon fat. Coconut, palm kernel, or palm oils. Regular salad dressings. Other Pickles and olives. Salted popcorn and pretzels. The items listed above may not be a complete list of foods and beverages to avoid. Contact your dietitian for more information. WHERE CAN I FIND MORE INFORMATION? National Heart, Lung, and Blood Institute: travelstabloid.com   This information is not intended to replace advice given to you by your health care provider. Make sure you discuss any questions you have with your health care provider.   Document  Released: 07/07/2011 Document Revised: 08/08/2014 Document Reviewed: 05/22/2013 Elsevier Interactive Patient Education [employer].

## 2015-10-29 NOTE — Progress Notes (Signed)
Pre visit review using our clinic review tool, if applicable. No additional management support is needed unless otherwise documented below in the visit note. 

## 2015-10-29 NOTE — Assessment & Plan Note (Signed)
Improved on Lotrel 10/20, still remains over goal.  Will increase dose to 10/40 mg. Strongly encouraged him to check his BP at work with the nurse. Advised he start taking BP meds at night to help with sluggish feeling.  Follow up in 1 month, BMP next visit.

## 2015-10-29 NOTE — Assessment & Plan Note (Signed)
No improvement on Singular.  Failed numerous OTC treatments in past. Will trial Xyzal. If no improvement will send to ENT for further evaluation. Although I suggested a change in his work environment would help as he works in a Futures traderfreezer/cooler during the day.

## 2015-10-29 NOTE — Progress Notes (Signed)
Subjective:    Patient ID: Maurice GreaserMaximillian Henderson, male    DOB: 07/16/1976, 40 y.o.   MRN: [id number]  HPI  Mr. Maurice Henderson is a 40 year old male who presents today for follow up.  1) Sinus Pressure/Allergic Rhinitis: Chronic with nasal congestion for numerous years. No improvement with OTC antihistamines, Flonase, or Singulair that was started last visit. He was offered an ENT evaluation last visit for which he declined. He's been on the Singulair for the past 3-4 weeks and has not noticed any improvement. His main symptom is nasal congestion. Denies fevers, cough.  2) Essential Hypertension: Uncontrolled since initial visit several months ago. He was evaluated in early March with readings of 170's/110's. He was changed to Lotrel 10/20 mg during that visit. His blood pressure is improved since last visit, however, elevated today in the clinic at 150/78. He's noticed feeling sluggish about 3 hours after taking his BP medication that he takes in the morning. Denies chest pain, dizziness, visual disturbances.  Since his last visit he's not checked his BP with his nurse at work as recommended.  3) Rash: Located to posterior chest wall intermittently for the past 3 years. He describes his rash as itchy and painful. He's not applied anything OTC for his symptoms. His wife will "pop" some of the bumps which causes a relief in pressure/discomfort.    Review of Systems  Constitutional: Negative for fever.  HENT: Positive for congestion. Negative for sinus pressure.   Respiratory: Negative for cough and shortness of breath.   Cardiovascular: Negative for chest pain.  Skin: Positive for rash.  Neurological: Negative for dizziness and headaches.       Past Medical History  Diagnosis Date  . Allergy   . Arthritis   . Hypertension   . Asthma     during childhood    Social History   Social History  . Marital Status: Married    Spouse Name: N/A  . Number of Children: N/A  . Years of Education:  N/A   Occupational History  . Not on file.   Social History Main Topics  . Smoking status: Never Smoker   . Smokeless tobacco: Not on file  . Alcohol Use: 0.0 oz/week    0 Standard drinks or equivalent per week     Comment: rarely  . Drug Use: Not on file  . Sexual Activity: Not on file   Other Topics Concern  . Not on file   Social History Narrative   In a relationship.   3 children.   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   Moved to [place] from [place].   Enjoys going to the shooting range, paintball.     No past surgical history on file.  Family History  Problem Relation Age of Onset  . Arthritis Mother   . Arthritis Father   . Hyperlipidemia Father   . Hypertension Father   . Heart disease Father     No Known Allergies  Current Outpatient Prescriptions on File Prior to Visit  Medication Sig Dispense Refill  . fluticasone (FLONASE) 50 MCG/ACT nasal spray Place 2 sprays into both nostrils daily. (Patient not taking: Reported on 10/29/2015) 16 g 0   No current facility-administered medications on file prior to visit.    BP 150/78 mmHg  Pulse 77  Temp(Src) 97.7 F (36.5 C) (Oral)  Ht 6\' 2"  (1.88 m)  Wt 227 lb 1.9 oz (103.021 kg)  BMI 29.15 kg/m2  SpO2 97%  Objective:   Physical Exam  Constitutional: He appears well-nourished.  HENT:  Right Ear: Tympanic membrane and ear canal normal.  Left Ear: Tympanic membrane and ear canal normal.  Nose: Mucosal edema present. Right sinus exhibits no maxillary sinus tenderness and no frontal sinus tenderness. Left sinus exhibits no maxillary sinus tenderness and no frontal sinus tenderness.  Mouth/Throat: Oropharynx is clear and moist.  Neck: Neck supple.  Cardiovascular: Normal rate and regular rhythm.   Pulmonary/Chest: Effort normal and breath sounds normal. He has no wheezes. He has no rales.  Skin: Skin is warm and dry.          Assessment & Plan:

## 2015-11-30 ENCOUNTER — Ambulatory Visit: Payer: BLUE CROSS/BLUE SHIELD | Admitting: Primary Care

## 2015-11-30 ENCOUNTER — Telehealth: Payer: Self-pay | Admitting: Primary Care

## 2015-11-30 NOTE — Telephone Encounter (Signed)
Patient did not come for their scheduled appointment today for 1 month follow up Please let me know if the patient needs to be contacted immediately for follow up or if no follow up is necessary.   ° °

## 2015-11-30 NOTE — Telephone Encounter (Signed)
Yes, please reschedule him at his convenience.

## 2015-12-01 NOTE — Telephone Encounter (Signed)
Spoke with pt He stated he had death in family and was in [place]. He will call back to r/s when he gets back in town

## 2016-03-21 ENCOUNTER — Other Ambulatory Visit: Payer: Self-pay | Admitting: Primary Care

## 2016-03-21 DIAGNOSIS — I10 Essential (primary) hypertension: Secondary | ICD-10-CM

## 2016-03-21 NOTE — Telephone Encounter (Signed)
Ok to refill? Electronically refill request for  amLODipine-benazepril (LOTREL) 10-40 MG capsule Take 1 capsule by mouth daily. Dispense: 30 capsule Refills: 3  Last prescribed and seen on 10/29/2015.

## 2016-03-23 NOTE — Telephone Encounter (Signed)
Message left for patient to return my call on 03/22/2016 and 03/23/2016

## 2016-05-01 ENCOUNTER — Other Ambulatory Visit: Payer: Self-pay | Admitting: Primary Care

## 2016-05-01 DIAGNOSIS — I10 Essential (primary) hypertension: Secondary | ICD-10-CM

## 2016-06-01 ENCOUNTER — Other Ambulatory Visit: Payer: Self-pay | Admitting: Primary Care

## 2016-06-01 DIAGNOSIS — I10 Essential (primary) hypertension: Secondary | ICD-10-CM

## 2016-07-11 ENCOUNTER — Other Ambulatory Visit: Payer: Self-pay | Admitting: Primary Care

## 2016-07-11 DIAGNOSIS — I10 Essential (primary) hypertension: Secondary | ICD-10-CM

## 2016-11-26 ENCOUNTER — Other Ambulatory Visit: Payer: Self-pay | Admitting: Primary Care

## 2016-11-26 DIAGNOSIS — I10 Essential (primary) hypertension: Secondary | ICD-10-CM

## 2017-01-10 ENCOUNTER — Other Ambulatory Visit: Payer: Self-pay | Admitting: Family Medicine

## 2017-01-10 DIAGNOSIS — I10 Essential (primary) hypertension: Secondary | ICD-10-CM

## 2017-01-10 NOTE — Telephone Encounter (Signed)
Electronic refill request. Last office visit:   10/29/15  Notation made at last RF that OV was needed for further refills.  Last Filled:    30 capsule 0 11/29/2016  Please advise.

## 2017-01-11 NOTE — Telephone Encounter (Signed)
Please call and reschedule patient for an office visit. He is overdue for a BMP.

## 2017-01-11 NOTE — Telephone Encounter (Signed)
Message left for patient to return my call.  

## 2017-01-30 NOTE — Telephone Encounter (Signed)
Tried to call patient on Friday 01/27/2017.  Will send letter since patient does not return phone in the past.

## 2017-02-06 ENCOUNTER — Other Ambulatory Visit: Payer: Self-pay | Admitting: Primary Care

## 2017-02-06 DIAGNOSIS — I10 Essential (primary) hypertension: Secondary | ICD-10-CM

## 2017-03-16 ENCOUNTER — Other Ambulatory Visit: Payer: Self-pay | Admitting: Primary Care

## 2017-03-16 DIAGNOSIS — I10 Essential (primary) hypertension: Secondary | ICD-10-CM

## 2017-03-21 ENCOUNTER — Ambulatory Visit: Payer: BLUE CROSS/BLUE SHIELD | Admitting: Primary Care

## 2017-03-22 ENCOUNTER — Telehealth: Payer: Self-pay | Admitting: Primary Care

## 2017-03-22 NOTE — Telephone Encounter (Signed)
Patient did not come in for their appointment on 03/21/17 for bp med check  Please let me know if patient needs to be contacted immediately for follow up or no follow up needed. Do you want to charge the NSF?

## 2017-03-22 NOTE — Telephone Encounter (Signed)
Needs follow up, no fee.

## 2017-03-29 NOTE — Telephone Encounter (Signed)
Lm to reschedule appt

## 2017-03-31 NOTE — Telephone Encounter (Signed)
Called to reschedule, busy signal

## 2017-04-10 ENCOUNTER — Encounter: Payer: Self-pay | Admitting: Primary Care

## 2017-04-10 NOTE — Telephone Encounter (Signed)
Sent letter to reschedule appt.

## 2017-04-16 ENCOUNTER — Other Ambulatory Visit: Payer: Self-pay | Admitting: Primary Care

## 2017-04-16 DIAGNOSIS — I10 Essential (primary) hypertension: Secondary | ICD-10-CM

## 2017-04-27 ENCOUNTER — Other Ambulatory Visit: Payer: Self-pay | Admitting: *Deleted

## 2017-04-27 DIAGNOSIS — I10 Essential (primary) hypertension: Secondary | ICD-10-CM

## 2017-04-27 MED ORDER — AMLODIPINE BESY-BENAZEPRIL HCL 10-40 MG PO CAPS: ORAL_CAPSULE | ORAL | 0 refills | Status: DC

## 2017-04-28 ENCOUNTER — Ambulatory Visit (INDEPENDENT_AMBULATORY_CARE_PROVIDER_SITE_OTHER)
Admission: RE | Admit: 2017-04-28 | Discharge: 2017-04-28 | Disposition: A | Discharge status: Home / Self Care | Payer: BLUE CROSS/BLUE SHIELD | Source: Ambulatory Visit | Attending: Primary Care | Admitting: Primary Care

## 2017-04-28 ENCOUNTER — Encounter: Payer: Self-pay | Admitting: Primary Care

## 2017-04-28 ENCOUNTER — Ambulatory Visit (INDEPENDENT_AMBULATORY_CARE_PROVIDER_SITE_OTHER): Payer: BLUE CROSS/BLUE SHIELD | Admitting: Primary Care

## 2017-04-28 VITALS — BP 148/82 | HR 92 | Temp 98.2°F | Ht 74.0 in | Wt 226.8 lb

## 2017-04-28 DIAGNOSIS — R739 Hyperglycemia, unspecified: Secondary | ICD-10-CM | POA: Diagnosis not present

## 2017-04-28 DIAGNOSIS — Z0000 Encounter for general adult medical examination without abnormal findings: Secondary | ICD-10-CM | POA: Diagnosis not present

## 2017-04-28 DIAGNOSIS — G8929 Other chronic pain: Secondary | ICD-10-CM | POA: Insufficient documentation

## 2017-04-28 DIAGNOSIS — I10 Essential (primary) hypertension: Secondary | ICD-10-CM

## 2017-04-28 DIAGNOSIS — M546 Pain in thoracic spine: Secondary | ICD-10-CM

## 2017-04-28 DIAGNOSIS — Z0001 Encounter for general adult medical examination with abnormal findings: Secondary | ICD-10-CM | POA: Insufficient documentation

## 2017-04-28 LAB — LIPID PANEL
Cholesterol: 174 mg/dL (ref 0–200)
HDL: 25 mg/dL — ABNORMAL LOW (ref >39.00)
NonHDL: 149.18
Total CHOL/HDL Ratio: 7
Triglycerides: 335 mg/dL — ABNORMAL HIGH (ref 0.0–149.0)
VLDL: 67 mg/dL — ABNORMAL HIGH (ref 0.0–40.0)

## 2017-04-28 LAB — COMPREHENSIVE METABOLIC PANEL
ALK PHOS: 87 U/L (ref 39–117)
ALT: 24 U/L (ref 0–53)
AST: 22 U/L (ref 0–37)
Albumin: 4.4 g/dL (ref 3.5–5.2)
BILIRUBIN TOTAL: 0.6 mg/dL (ref 0.2–1.2)
BUN: 12 mg/dL (ref 6–23)
CO2: 29 mEq/L (ref 19–32)
CREATININE: 1.15 mg/dL (ref 0.40–1.50)
Calcium: 9.8 mg/dL (ref 8.4–10.5)
Chloride: 101 mEq/L (ref 96–112)
GFR: 91.09 mL/min (ref >60.00)
Glucose, Bld: 198 mg/dL — ABNORMAL HIGH (ref 70–99)
Potassium: 4.1 mEq/L (ref 3.5–5.1)
Sodium: 135 mEq/L (ref 135–145)
Total Protein: 7.9 g/dL (ref 6.0–8.3)

## 2017-04-28 LAB — LDL CHOLESTEROL, DIRECT: LDL DIRECT: 104 mg/dL

## 2017-04-28 LAB — HEMOGLOBIN A1C: Hgb A1c MFr Bld: 9.1 % — ABNORMAL HIGH (ref 4.6–6.5)

## 2017-04-28 NOTE — Assessment & Plan Note (Signed)
Tetanus and influenza vaccinations due, he declines both. Discussed the importance of a healthy diet and regular exercise in order for weight loss, and to reduce the risk of other medical problems. Exam unremarkable. Labs pending. Follow up in 1 year.

## 2017-04-28 NOTE — Patient Instructions (Addendum)
Complete lab work and xray prior to leaving today. I will notify you of your results once received.   Continue amlodipine-benazepril 10-40 mg tablets for high blood pressure.   It's important to improve your diet by reducing consumption of fast food, fried food, processed snack foods, sugary drinks. Increase consumption of fresh vegetables and fruits, whole grains, water.  Ensure you are drinking 64 ounces of water daily.  Continue to monitor your blood pressure, it should run less than 135/90.  Follow up in 1 year for repeat physical or sooner if needed.  It was a pleasure to see you today!

## 2017-04-28 NOTE — Progress Notes (Signed)
Subjective:    Patient ID: Maurice Henderson, male    DOB: 05/07/1976, 41 y.o.   MRN: [id number]  HPI  Mr. Adebayo is a 41 year old male who presents today for follow up and CPE.  1) Essential Hypertension: Restarted the amlodipine-benazepril 10-40 mg yesterday. 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   2) Chronic Back Pain: Located to mid thoracic back for 3-4 months. His pain is intermittent that will radiate up his spine. He mostly notices this pain when at work. He works packing and lifting heavy boxes. He denies weakness, numbness/tingling, trauma.   Immunizations: -Tetanus: Has not had in over 10 years.  -Influenza: Declines   Diet: He endorses a fair diet.  Two weeks ago started a Paleo Diet.  Breakfast: Oatmeal, left overs Lunch: Soup, burger and fries, left overs Dinner: Chicken, rice, vegetables, occasional fast food Snacks: None Desserts: Occasionally  Beverages: Juice, Lemonade, Soda, no water  Exercise: He does not currently exercise, active at work. Eye exam: Completed in 2017 Dental exam: Has not completed recently.    Review of Systems  Constitutional: Negative for unexpected weight change.  HENT: Negative for rhinorrhea.   Respiratory: Negative for cough and shortness of breath.   Cardiovascular: Negative for chest pain.  Gastrointestinal: Negative for constipation and diarrhea.  Genitourinary: Negative for difficulty urinating.  Musculoskeletal: Positive for back pain. Negative for arthralgias and myalgias.  Skin: Negative for rash.  Allergic/Immunologic: Negative for environmental allergies.  Neurological: Negative for dizziness, numbness and headaches.  Psychiatric/Behavioral:       Denies concerns for anxiety and depression       Past Medical History:  Diagnosis Date  . Allergy   . Arthritis   . Asthma    during childhood  . Hypertension      Social History   Social History  .  Marital status: Married    Spouse name: N/A  . Number of children: N/A  . Years of education: N/A   Occupational History  . Not on file.   Social History Main Topics  . Smoking status: Never Smoker  . Smokeless tobacco: Never Used  . Alcohol use 0.0 oz/week     Comment: rarely  . Drug use: Unknown  . Sexual activity: Not on file   Other Topics Concern  . Not on file   Social History Narrative   In a relationship.   3 children.   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   Moved to [place] from [place].   Enjoys going to the shooting range, paintball.     No past surgical history on file.  Family History  Problem Relation Age of Onset  . Arthritis Mother   . Arthritis Father   . Hyperlipidemia Father   . Hypertension Father   . Heart disease Father     No Known Allergies  Current Outpatient Prescriptions on File Prior to Visit  Medication Sig Dispense Refill  . amLODipine-benazepril (LOTREL) 10-40 MG capsule TAKE 1 CAPSULE BY MOUTH EVERY DAY. NEEDS OFFICE VISIT FOR FURTHER REFILLS 30 capsule 0   No current facility-administered medications on file prior to visit.     BP (!) 148/82   Pulse 92   Temp 98.2 F (36.8 C) (Oral)   Ht  (1.88 m)   Wt 226 lb 12.8 oz (102.9 kg)   SpO2 95%   BMI 29.12 kg/m    Objective:   Physical Exam  Constitutional: He is oriented to person, place, and time. He appears well-nourished.  HENT:  Right Ear: Tympanic membrane and ear canal normal.  Left Ear: Tympanic membrane and ear canal normal.  Nose: Nose normal. Right sinus exhibits no maxillary sinus tenderness and no frontal sinus tenderness. Left sinus exhibits no maxillary sinus tenderness and no frontal sinus tenderness.  Mouth/Throat: Oropharynx is clear and moist.  Eyes: Pupils are equal, round, and reactive to light. Conjunctivae and EOM are normal.  Neck: Neck supple. Carotid bruit is not present. No thyromegaly present.  Cardiovascular: Normal rate, regular  rhythm and normal heart sounds.   Pulmonary/Chest: Effort normal and breath sounds normal. He has no wheezes. He has no rales.  Abdominal: Soft. Bowel sounds are normal. There is no tenderness.  Musculoskeletal: Normal range of motion.       Thoracic back: He exhibits normal range of motion and no tenderness.       Back:  Intermittent pain   Neurological: He is alert and oriented to person, place, and time. He has normal reflexes. No cranial nerve deficit.  Skin: Skin is warm and dry.  Psychiatric: He has a normal mood and affect.          Assessment & Plan:

## 2017-04-28 NOTE — Assessment & Plan Note (Signed)
A1C pending today. 

## 2017-04-28 NOTE — Assessment & Plan Note (Signed)
Above goal in the office today, suspect this is secondary to him restarting his Lotrel yesterday. Will have him continue to monitor and notify for readings at or above 135/90 on a consistent basis. CMP pending.

## 2017-04-28 NOTE — Assessment & Plan Note (Signed)
Present for the past 3-4 months, worse recently. Exam today unremarkable, sign of cauda equina. Suspect this is secondary to repetitive lifting at work. Given location of pain, will check plain film of the thoracic spine for further evaluation.

## 2017-05-04 ENCOUNTER — Encounter: Payer: Self-pay | Admitting: *Deleted

## 2017-05-24 ENCOUNTER — Other Ambulatory Visit: Payer: Self-pay | Admitting: Primary Care

## 2017-05-24 DIAGNOSIS — I10 Essential (primary) hypertension: Secondary | ICD-10-CM

## 2017-09-01 LAB — HM DIABETES EYE EXAM

## 2017-12-06 ENCOUNTER — Encounter: Payer: Self-pay | Admitting: Primary Care

## 2017-12-06 ENCOUNTER — Ambulatory Visit (INDEPENDENT_AMBULATORY_CARE_PROVIDER_SITE_OTHER): Payer: BLUE CROSS/BLUE SHIELD | Admitting: Primary Care

## 2017-12-06 ENCOUNTER — Other Ambulatory Visit: Payer: Self-pay | Admitting: Primary Care

## 2017-12-06 VITALS — BP 144/92 | HR 84 | Temp 98.2°F | Ht 74.0 in | Wt 226.2 lb

## 2017-12-06 DIAGNOSIS — Z0001 Encounter for general adult medical examination with abnormal findings: Secondary | ICD-10-CM

## 2017-12-06 DIAGNOSIS — I10 Essential (primary) hypertension: Secondary | ICD-10-CM

## 2017-12-06 DIAGNOSIS — J309 Allergic rhinitis, unspecified: Secondary | ICD-10-CM

## 2017-12-06 DIAGNOSIS — E119 Type 2 diabetes mellitus without complications: Secondary | ICD-10-CM | POA: Diagnosis not present

## 2017-12-06 DIAGNOSIS — Z0000 Encounter for general adult medical examination without abnormal findings: Secondary | ICD-10-CM

## 2017-12-06 DIAGNOSIS — R0981 Nasal congestion: Secondary | ICD-10-CM

## 2017-12-06 DIAGNOSIS — E785 Hyperlipidemia, unspecified: Secondary | ICD-10-CM

## 2017-12-06 DIAGNOSIS — E1169 Type 2 diabetes mellitus with other specified complication: Secondary | ICD-10-CM

## 2017-12-06 LAB — COMPREHENSIVE METABOLIC PANEL
ALT: 26 U/L (ref 0–53)
AST: 23 U/L (ref 0–37)
Albumin: 4.3 g/dL (ref 3.5–5.2)
Alkaline Phosphatase: 81 U/L (ref 39–117)
BILIRUBIN TOTAL: 0.4 mg/dL (ref 0.2–1.2)
BUN: 16 mg/dL (ref 6–23)
CALCIUM: 9.9 mg/dL (ref 8.4–10.5)
CO2: 27 meq/L (ref 19–32)
Chloride: 99 mEq/L (ref 96–112)
Creatinine, Ser: 1.25 mg/dL (ref 0.40–1.50)
GFR: 82.47 mL/min (ref >60.00)
Glucose, Bld: 258 mg/dL — ABNORMAL HIGH (ref 70–99)
Potassium: 4 mEq/L (ref 3.5–5.1)
Sodium: 134 mEq/L — ABNORMAL LOW (ref 135–145)
Total Protein: 7.8 g/dL (ref 6.0–8.3)

## 2017-12-06 LAB — LIPID PANEL
CHOL/HDL RATIO: 7
CHOLESTEROL: 168 mg/dL (ref 0–200)
HDL: 25.6 mg/dL — ABNORMAL LOW (ref >39.00)
NonHDL: 142.61
Triglycerides: 327 mg/dL — ABNORMAL HIGH (ref 0.0–149.0)
VLDL: 65.4 mg/dL — ABNORMAL HIGH (ref 0.0–40.0)

## 2017-12-06 LAB — LDL CHOLESTEROL, DIRECT: LDL DIRECT: 112 mg/dL

## 2017-12-06 LAB — HEMOGLOBIN A1C: Hgb A1c MFr Bld: 8.9 % — ABNORMAL HIGH (ref 4.6–6.5)

## 2017-12-06 MED ORDER — METFORMIN HCL 1000 MG PO TABS: ORAL_TABLET | ORAL | 1 refills | Status: DC

## 2017-12-06 MED ORDER — HYDROCHLOROTHIAZIDE 25 MG PO TABS: ORAL_TABLET | ORAL | 0 refills | Status: DC

## 2017-12-06 MED ORDER — ATORVASTATIN CALCIUM 10 MG PO TABS: ORAL_TABLET | ORAL | 1 refills | Status: DC

## 2017-12-06 NOTE — Assessment & Plan Note (Signed)
Td and pneumonia vaccinations due, he declines both. Discussed the importance of a healthy diet and regular exercise in order for weight loss, and to reduce the risk of any potential medical problems. Exam unremarkable. Labs pending. Follow up in 1 year for CPE.

## 2017-12-06 NOTE — Progress Notes (Signed)
Subjective:    Patient ID: Maurice Henderson Born, male    DOB: Dec 12, 1975, 42 y.o.   MRN: [id number]  HPI  Mr. Maurice Henderson is a 42 year old male who presents today for complete physical.    Immunizations: -Tetanus:Completed over 10 years ago. Declines -Influenza: Did not complete last season -Pneumonia: Never completed, declines   Diet: He endorses a fair diet. Breakfast: Oatmeal, hash browns, egg sandwich Lunch: Left overs Dinner: Chicken, rice, vegetables,  Snacks: None Desserts: Daily, cookies, apple pie Beverages: Some water with flavored packets, diet soda, occasional regular soda, occasional juice  Exercise: He is not exercising Eye exam: Completed recently  Dental exam: No recent exam  BP Readings from Last 3 Encounters:  12/06/17 (!) 144/92  04/28/17 (!) 148/82  10/29/15 (!) 150/78   He will occasionally have his BP checked at work with readings of 140/90's. He is compliant to his blood pressure medication.   Endorses chronic nasal congestion without post nasal drip, cough, rhinorrhea despite treatment with numerous antihistamines and nasal sprays. History of broken nose x 6. He's never seen ENT.    Review of Systems  Constitutional: Positive for fatigue. Negative for unexpected weight change.  HENT: Negative for rhinorrhea.   Respiratory: Negative for cough and shortness of breath.   Cardiovascular: Negative for chest pain.  Gastrointestinal: Negative for constipation and diarrhea.  Genitourinary: Negative for difficulty urinating.  Musculoskeletal: Negative for arthralgias and myalgias.  Skin: Negative for rash.  Allergic/Immunologic: Negative for environmental allergies.  Neurological: Negative for dizziness, numbness and headaches.  Psychiatric/Behavioral: The patient is not nervous/anxious.        Past Medical History:  Diagnosis Date  . Allergy   . Arthritis   . Asthma    during childhood  . Hypertension      Social History   Socioeconomic  History  . Marital status: Married    Spouse name: Not on file  . Number of children: Not on file  . Years of education: Not on file  . Highest education level: Not on file  Occupational History  . Not on file  Social Needs  . Financial resource strain: Not on file  . Food insecurity:    Worry: Not on file    Inability: Not on file  . Transportation needs:    Medical: Not on file    Non-medical: Not on file  Tobacco Use  . Smoking status: Never Smoker  . Smokeless tobacco: Never Used  Substance and Sexual Activity  . Alcohol use: Yes    Alcohol/week: 0.0 oz    Comment: rarely  . Drug use: Not on file  . Sexual activity: Not on file  Lifestyle  . Physical activity:    Days per week: Not on file    Minutes per session: Not on file  . Stress: Not on file  Relationships  . Social connections:    Talks on phone: Not on file    Gets together: Not on file    Attends religious service: Not on file    Active member of club or organization: Not on file    Attends meetings of clubs or organizations: Not on file    Relationship status: Not on file  . Intimate partner violence:    Fear of current or ex partner: Not on file    Emotionally abused: Not on file    Physically abused: Not on file    Forced sexual activity: Not on file  Other Topics Concern  .  Not on file  Social History Narrative   In a relationship.   3 children.   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   Moved to [place] from [place].   Enjoys going to the shooting range, paintball.     No past surgical history on file.  Family History  Problem Relation Age of Onset  . Arthritis Mother   . Arthritis Father   . Hyperlipidemia Father   . Hypertension Father   . Heart disease Father     No Known Allergies  Current Outpatient Medications on File Prior to Visit  Medication Sig Dispense Refill  . amLODipine-benazepril (LOTREL) 10-40 MG capsule Take 1 capsule by mouth daily. 30 capsule 5   No current  facility-administered medications on file prior to visit.     BP (!) 144/92   Pulse 84   Temp 98.2 F (36.8 C) (Oral)   Ht  (1.88 m)   Wt 226 lb 4 oz (102.6 kg)   BMI 29.05 kg/m    Objective:   Physical Exam  Constitutional: He is oriented to person, place, and time. He appears well-nourished.  HENT:  Right Ear: Tympanic membrane and ear canal normal.  Left Ear: Tympanic membrane and ear canal normal.  Nose: Nose normal. Right sinus exhibits no maxillary sinus tenderness and no frontal sinus tenderness. Left sinus exhibits no maxillary sinus tenderness and no frontal sinus tenderness.  Mouth/Throat: Oropharynx is clear and moist.  Eyes: Pupils are equal, round, and reactive to light. Conjunctivae and EOM are normal.  Neck: Neck supple. Carotid bruit is not present. No thyromegaly present.  Cardiovascular: Normal rate, regular rhythm and normal heart sounds.  Pulmonary/Chest: Effort normal and breath sounds normal. He has no wheezes. He has no rales.  Abdominal: Soft. Bowel sounds are normal. There is no tenderness.  Musculoskeletal: Normal range of motion.  Neurological: He is alert and oriented to person, place, and time. He has normal reflexes. No cranial nerve deficit.  Skin: Skin is warm and dry.  Psychiatric: He has a normal mood and affect.          Assessment & Plan:

## 2017-12-06 NOTE — Assessment & Plan Note (Signed)
Diagnosed in 2016 from records. Last A1C of 9.1 in September 2018. Numerous phone calls were made, including a letter sent to his house without response for treatment.  His phone number is correct, verified with him today. Discussed that we will likely need to treat his diabetes based off of his pending A1C. Also discussed that he will need to follow up as recommended for treatment and to prevent future complications of uncontrolled diabetes, he verbalized understanding.  Declines pneumonia vaccination. Eye exam UTD. Managed on ACE. Discussed potential need for statin based off of Lipid panel.   Follow up based off of A1C result that is pending,

## 2017-12-06 NOTE — Assessment & Plan Note (Signed)
Uncontrolled, compliant to Lotrel. Add in HCTZ 25 mg daily. Follow up in 2-3 weeks for BP check and BMP.

## 2017-12-06 NOTE — Assessment & Plan Note (Signed)
Chronic nasal congestion, no improvement with numerous antihistamine treatments. Referral placed to ENT for further evaluation.

## 2017-12-06 NOTE — Patient Instructions (Addendum)
Stop by the lab prior to leaving today. I will notify you of your results once received.   Start exercising. You should be getting 150 minutes of moderate intensity exercise weekly.  I do recommend a pneumonia vaccination, please notify me if you change your mind.  We may need to start diabetes medication, if we do then you'll need to follow up as we recommend.  Start hydrochlorothiazide 25 mg tablets for high blood pressure. Take 1 tablet by mouth once daily. Continue taking amlodipine-benazepril. Try taking these at bedtime.  You must improve your diet. Increase vegetables, fruit, whole grains, lean protein. Limit hash browns, rice, starchy food, cookies, pies.   Ensure you are consuming 64 ounces of water daily.  Schedule a follow up visit in 2-3 weeks for blood pressure check.  It was a pleasure to see you today!   Diabetes Mellitus and Nutrition When you have diabetes (diabetes mellitus), it is very important to have healthy eating habits because your blood sugar (glucose) levels are greatly affected by what you eat and drink. Eating healthy foods in the appropriate amounts, at about the same times every day, can help you:  Control your blood glucose.  Lower your risk of heart disease.  Improve your blood pressure.  Reach or maintain a healthy weight.  Every person with diabetes is different, and each person has different needs for a meal plan. Your health care provider may recommend that you work with a diet and nutrition specialist (dietitian) to make a meal plan that is best for you. Your meal plan may vary depending on factors such as:  The calories you need.  The medicines you take.  Your weight.  Your blood glucose, blood pressure, and cholesterol levels.  Your activity level.  Other health conditions you have, such as heart or kidney disease.  How do carbohydrates affect me? Carbohydrates affect your blood glucose level more than any other type of food. Eating  carbohydrates naturally increases the amount of glucose in your blood. Carbohydrate counting is a method for keeping track of how many carbohydrates you eat. Counting carbohydrates is important to keep your blood glucose at a healthy level, especially if you use insulin or take certain oral diabetes medicines. It is important to know how many carbohydrates you can safely have in each meal. This is different for every person. Your dietitian can help you calculate how many carbohydrates you should have at each meal and for snack. Foods that contain carbohydrates include:  Bread, cereal, rice, pasta, and crackers.  Potatoes and corn.  Peas, beans, and lentils.  Milk and yogurt.  Fruit and juice.  Desserts, such as cakes, cookies, ice cream, and candy.  How does alcohol affect me? Alcohol can cause a sudden decrease in blood glucose (hypoglycemia), especially if you use insulin or take certain oral diabetes medicines. Hypoglycemia can be a life-threatening condition. Symptoms of hypoglycemia (sleepiness, dizziness, and confusion) are similar to symptoms of having too much alcohol. If your health care provider says that alcohol is safe for you, follow these guidelines:  Limit alcohol intake to no more than 1 drink per day for nonpregnant women and 2 drinks per day for men. One drink equals 12 oz of beer, 5 oz of wine, or 1 oz of hard liquor.  Do not drink on an empty stomach.  Keep yourself hydrated with water, diet soda, or unsweetened iced tea.  Keep in mind that regular soda, juice, and other mixers may contain a lot of  sugar and must be counted as carbohydrates.  What are tips for following this plan? Reading food labels  Start by checking the serving size on the label. The amount of calories, carbohydrates, fats, and other nutrients listed on the label are based on one serving of the food. Many foods contain more than one serving per package.  Check the total grams (g) of  carbohydrates in one serving. You can calculate the number of servings of carbohydrates in one serving by dividing the total carbohydrates by 15. For example, if a food has 30 g of total carbohydrates, it would be equal to 2 servings of carbohydrates.  Check the number of grams (g) of saturated and trans fats in one serving. Choose foods that have low or no amount of these fats.  Check the number of milligrams (mg) of sodium in one serving. Most people should limit total sodium intake to less than 2,300 mg per day.  Always check the nutrition information of foods labeled as "low-fat" or "nonfat". These foods may be higher in added sugar or refined carbohydrates and should be avoided.  Talk to your dietitian to identify your daily goals for nutrients listed on the label. Shopping  Avoid buying canned, premade, or processed foods. These foods tend to be high in fat, sodium, and added sugar.  Shop around the outside edge of the grocery store. This includes fresh fruits and vegetables, bulk grains, fresh meats, and fresh dairy. Cooking  Use low-heat cooking methods, such as baking, instead of high-heat cooking methods like deep frying.  Cook using healthy oils, such as olive, canola, or sunflower oil.  Avoid cooking with butter, cream, or high-fat meats. Meal planning  Eat meals and snacks regularly, preferably at the same times every day. Avoid going long periods of time without eating.  Eat foods high in fiber, such as fresh fruits, vegetables, beans, and whole grains. Talk to your dietitian about how many servings of carbohydrates you can eat at each meal.  Eat 4-6 ounces of lean protein each day, such as lean meat, chicken, fish, eggs, or tofu. 1 ounce is equal to 1 ounce of meat, chicken, or fish, 1 egg, or 1/4 cup of tofu.  Eat some foods each day that contain healthy fats, such as avocado, nuts, seeds, and fish. Lifestyle   Check your blood glucose regularly.  Exercise at least  30 minutes 5 or more days each week, or as told by your health care provider.  Take medicines as told by your health care provider.  Do not use any products that contain nicotine or tobacco, such as cigarettes and e-cigarettes. If you need help quitting, ask your health care provider.  Work with a Veterinary surgeon or diabetes educator to identify strategies to manage stress and any emotional and social challenges. What are some questions to ask my health care provider?  Do I need to meet with a diabetes educator?  Do I need to meet with a dietitian?  What number can I call if I have questions?  When are the best times to check my blood glucose? Where to find more information:  American Diabetes Association: diabetes.org/food-and-fitness/food  Academy of Nutrition and Dietetics: https://www.vargas.com/  [employer] of Diabetes and Digestive and Kidney Diseases (NIH): FindJewelers.cz Summary  A healthy meal plan will help you control your blood glucose and maintain a healthy lifestyle.  Working with a diet and nutrition specialist (dietitian) can help you make a meal plan that is best for you.  Keep  in mind that carbohydrates and alcohol have immediate effects on your blood glucose levels. It is important to count carbohydrates and to use alcohol carefully. This information is not intended to replace advice given to you by your health care provider. Make sure you discuss any questions you have with your health care provider. Document Released: 04/14/2005 Document Revised: 08/22/2016 Document Reviewed: 08/22/2016 Elsevier Interactive Patient Education  [employer].

## 2017-12-08 ENCOUNTER — Encounter: Payer: Self-pay | Admitting: *Deleted

## 2017-12-26 ENCOUNTER — Ambulatory Visit: Payer: BLUE CROSS/BLUE SHIELD | Admitting: Primary Care

## 2017-12-26 DIAGNOSIS — Z0289 Encounter for other administrative examinations: Secondary | ICD-10-CM

## 2017-12-28 ENCOUNTER — Other Ambulatory Visit: Payer: Self-pay | Admitting: Primary Care

## 2017-12-28 DIAGNOSIS — I10 Essential (primary) hypertension: Secondary | ICD-10-CM

## 2018-01-29 ENCOUNTER — Other Ambulatory Visit: Payer: Self-pay | Admitting: Primary Care

## 2018-01-29 DIAGNOSIS — I10 Essential (primary) hypertension: Secondary | ICD-10-CM

## 2018-02-16 ENCOUNTER — Ambulatory Visit: Payer: BLUE CROSS/BLUE SHIELD | Admitting: Primary Care

## 2018-02-16 ENCOUNTER — Encounter: Payer: Self-pay | Admitting: Primary Care

## 2018-02-16 VITALS — BP 122/82 | HR 86 | Temp 98.2°F | Ht 74.0 in | Wt 226.2 lb

## 2018-02-16 DIAGNOSIS — I10 Essential (primary) hypertension: Secondary | ICD-10-CM

## 2018-02-16 DIAGNOSIS — E119 Type 2 diabetes mellitus without complications: Secondary | ICD-10-CM

## 2018-02-16 DIAGNOSIS — R41840 Attention and concentration deficit: Secondary | ICD-10-CM | POA: Diagnosis not present

## 2018-02-16 NOTE — Assessment & Plan Note (Signed)
Stopped taking Metformin shortly after prescribed due to side effects. Again discussed the importance of diabetes control and the need for treatment.   Resume metformin, discussed in detail to start with 1 tablet once daily x 2 weeks, then 1 tablet BID thereafter.  Follow up in 3 months for re-evaluation.

## 2018-02-16 NOTE — Patient Instructions (Signed)
Resume your Metformin 1000 mg tablets. Take 1 tablet by mouth once daily for two weeks, then increase to 1 tablet twice daily thereafter.  You may have some stomach upset/diarrhea when resuming Metformin, this will pass. Please call me if you can't tolerate this medication.  You will be contacted regarding your referral to psychiatry for ADD testing and treatment.  Please let us know if you have not been contacted within one week.   Please schedule a follow up appointment in 3 months for diabetes check.  It was a pleasure to see you today!

## 2018-02-16 NOTE — Progress Notes (Signed)
Subjective:    Patient ID: Maurice GreaserMaximillian Henderson, male    DOB: 08/21/1975, 42 y.o.   MRN: [id number]  HPI  Mr. Maurice Henderson is a 42 year old male who presents today to discuss ADD.  1) Essential Hypertension:  BP Readings from Last 3 Encounters:  02/16/18 122/82  12/06/17 (!) 144/92  04/28/17 (!) 148/82   Currently taking amlodipine-benazepril 10-40 mg. He stopped taking HCTZ in early June 2019 as he felt "horrbile" with symptoms of headache, felt slow/sluggish. He is not checking his BP at home. He denies those symptoms now.  2) Type 2 Diabetes: Currently prescribed Metformin 1000 mg BID for A1C of 8.9 in late May 2019. He stopped taking Metformin in early June 2019 due to upset stomach. He believes his ADD has prevented him from taking care of himself as he often forgets to eat healthier and make better choices.   3) ADD: Diagnosed in childhood and once managed on two different medications at two different times. He thinks dexodren was the best for him in the past. He has symptoms of difficulty concentrating, difficulty remembering, difficulty completing tasks, feeling dazed. He will forget to pay bills. These symptoms are affecting his marriage, not so much his occupation as it is mainly physical. Symptoms have been more noticeable since early June 2019 when he and his wife started arguing about his symptoms.   He believes his ADD is the reason why he cannot take care of himself in terms of diet and medications. He's had no recent testing.    Review of Systems  Respiratory: Negative for shortness of breath.   Cardiovascular: Negative for chest pain.  Neurological: Negative for dizziness and headaches.  Psychiatric/Behavioral: Positive for decreased concentration.       See HPI       Past Medical History:  Diagnosis Date  . Allergy   . Arthritis   . Asthma    during childhood  . Hypertension      Social History   Socioeconomic History  . Marital status: Married    Spouse  name: Not on file  . Number of children: Not on file  . Years of education: Not on file  . Highest education level: Not on file  Occupational History  . Not on file  Social Needs  . Financial resource strain: Not on file  . Food insecurity:    Worry: Not on file    Inability: Not on file  . Transportation needs:    Medical: Not on file    Non-medical: Not on file  Tobacco Use  . Smoking status: Never Smoker  . Smokeless tobacco: Never Used  Substance and Sexual Activity  . Alcohol use: Yes    Alcohol/week: 0.0 oz    Comment: rarely  . Drug use: Not on file  . Sexual activity: Not on file  Lifestyle  . Physical activity:    Days per week: Not on file    Minutes per session: Not on file  . Stress: Not on file  Relationships  . Social connections:    Talks on phone: Not on file    Gets together: Not on file    Attends religious service: Not on file    Active member of club or organization: Not on file    Attends meetings of clubs or organizations: Not on file    Relationship status: Not on file  . Intimate partner violence:    Fear of current or ex partner: Not on file  Emotionally abused: Not on file    Physically abused: Not on file    Forced sexual activity: Not on file  Other Topics Concern  . Not on file  Social History Narrative   In a relationship.   3 children.   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   Moved to [place] from [place].   Enjoys going to the shooting range, paintball.     No past surgical history on file.  Family History  Problem Relation Age of Onset  . Arthritis Mother   . Arthritis Father   . Hyperlipidemia Father   . Hypertension Father   . Heart disease Father     No Known Allergies  Current Outpatient Medications on File Prior to Visit  Medication Sig Dispense Refill  . amLODipine-benazepril (LOTREL) 10-40 MG capsule TAKE 1 CAPSULE BY MOUTH EVERY DAY 30 capsule 0  . metFORMIN (GLUCOPHAGE) 1000 MG tablet Take 1 tablet by  mouth twice daily with food for diabetes. (Patient not taking: Reported on 02/16/2018) 180 tablet 1   No current facility-administered medications on file prior to visit.     BP 122/82   Pulse 86   Temp 98.2 F (36.8 C) (Oral)   Ht 6\' 2"  (1.88 m)   Wt 226 lb 4 oz (102.6 kg)   SpO2 98%   BMI 29.05 kg/m    Objective:   Physical Exam  Constitutional: He appears well-nourished.  Neck: Neck supple.  Cardiovascular: Normal rate and regular rhythm.  Respiratory: Effort normal and breath sounds normal.  Skin: Skin is warm and dry.  Psychiatric: He has a normal mood and affect.           Assessment & Plan:

## 2018-02-16 NOTE — Assessment & Plan Note (Signed)
Endorses childhood ADD, on treatment in the past. Discussed that he will need re-testing for symptoms, and given that he has uncontrolled diabetes and controlled hypertension, it would be best for him to be treated by psychiatry.   He was upset that he had to have re-testing, explained that re-testing is needed to gain the correct diagnosis and treatment plan. He agreed. Referral placed to psychiatry for treatment.

## 2018-02-16 NOTE — Assessment & Plan Note (Signed)
Stable on amlodipine-benazepril 10-40 mg, continue same. Discontinued HCTZ as he has not taken and also due to good BP control on Lotrel alone.

## 2018-03-02 ENCOUNTER — Other Ambulatory Visit: Payer: Self-pay | Admitting: Primary Care

## 2018-03-02 DIAGNOSIS — I10 Essential (primary) hypertension: Secondary | ICD-10-CM

## 2018-04-03 ENCOUNTER — Ambulatory Visit: Payer: BLUE CROSS/BLUE SHIELD | Admitting: Psychiatry

## 2018-05-31 ENCOUNTER — Ambulatory Visit: Payer: BLUE CROSS/BLUE SHIELD | Admitting: Primary Care

## 2018-05-31 DIAGNOSIS — Z0289 Encounter for other administrative examinations: Secondary | ICD-10-CM

## 2018-06-05 ENCOUNTER — Other Ambulatory Visit: Payer: Self-pay | Admitting: Primary Care

## 2018-06-05 DIAGNOSIS — I10 Essential (primary) hypertension: Secondary | ICD-10-CM

## 2018-09-22 ENCOUNTER — Other Ambulatory Visit: Payer: Self-pay | Admitting: Primary Care

## 2018-09-22 DIAGNOSIS — I10 Essential (primary) hypertension: Secondary | ICD-10-CM

## 2018-11-01 ENCOUNTER — Telehealth: Payer: Self-pay | Admitting: Primary Care

## 2018-11-01 NOTE — Telephone Encounter (Signed)
Called to schedule patient for follow up with labs via webex. Pt's wife states she will have him call office to schedule.

## 2018-12-26 ENCOUNTER — Other Ambulatory Visit: Payer: Self-pay | Admitting: Primary Care

## 2018-12-26 DIAGNOSIS — I10 Essential (primary) hypertension: Secondary | ICD-10-CM

## 2019-03-25 ENCOUNTER — Other Ambulatory Visit: Payer: Self-pay | Admitting: Primary Care

## 2019-03-25 DIAGNOSIS — I10 Essential (primary) hypertension: Secondary | ICD-10-CM

## 2019-03-26 ENCOUNTER — Other Ambulatory Visit: Payer: Self-pay

## 2019-03-26 ENCOUNTER — Encounter: Payer: Self-pay | Admitting: Internal Medicine

## 2019-03-26 ENCOUNTER — Ambulatory Visit (INDEPENDENT_AMBULATORY_CARE_PROVIDER_SITE_OTHER): Payer: BC Managed Care – PPO | Admitting: Internal Medicine

## 2019-03-26 DIAGNOSIS — Z20828 Contact with and (suspected) exposure to other viral communicable diseases: Secondary | ICD-10-CM | POA: Diagnosis not present

## 2019-03-26 DIAGNOSIS — R430 Anosmia: Secondary | ICD-10-CM | POA: Diagnosis not present

## 2019-03-26 DIAGNOSIS — R059 Cough, unspecified: Secondary | ICD-10-CM

## 2019-03-26 DIAGNOSIS — Z20822 Contact with and (suspected) exposure to covid-19: Secondary | ICD-10-CM

## 2019-03-26 DIAGNOSIS — R0989 Other specified symptoms and signs involving the circulatory and respiratory systems: Secondary | ICD-10-CM | POA: Diagnosis not present

## 2019-03-26 DIAGNOSIS — R05 Cough: Secondary | ICD-10-CM

## 2019-03-26 NOTE — Progress Notes (Signed)
Virtual Visit via Video Note  I connected with Maurice Henderson on 03/26/19 at  9:30 AM EDT by a video enabled telemedicine application and verified that I am speaking with the correct person using two identifiers.  Location: Patient: Home Provider: Office   I discussed the limitations of evaluation and management by telemedicine and the availability of in person appointments. The patient expressed understanding and agreed to proceed.  History of Present Illness:  Pt reports facial pain, runny nose, loss of smell. This started 5 days ago. He reports the facial pressure is behind his eyes. He denies visual changes or dizziness. He is blowing clear mucous out of his nose. The cough is dry and nonproductive. He denies headache, nasal congestion, ear pain, sore throat, loss of taste or SOB. He denies fever, chills or body aches. He has tried Ibuprofen with some relief. He has not had sick contacts that he is aware of. He reports he has had sinus infections in the past, but this is different.     Past Medical History:  Diagnosis Date  . Allergy   . Arthritis   . Asthma    during childhood  . Hypertension     Current Outpatient Medications  Medication Sig Dispense Refill  . amLODipine-benazepril (LOTREL) 10-40 MG capsule Take 1 capsule by mouth daily. WILL BE DUE FOR FOLLOW UP APPOINTMENT 30 capsule 1  . metFORMIN (GLUCOPHAGE) 1000 MG tablet Take 1 tablet by mouth twice daily with food for diabetes. (Patient not taking: Reported on 02/16/2018) 180 tablet 1   No current facility-administered medications for this visit.     No Known Allergies  Family History  Problem Relation Age of Onset  . Arthritis Mother   . Arthritis Father   . Hyperlipidemia Father   . Hypertension Father   . Heart disease Father     Social History   Socioeconomic History  . Marital status: Married    Spouse name: Not on file  . Number of children: Not on file  . Years of education: Not on file  .  Highest education level: Not on file  Occupational History  . Not on file  Social Needs  . Financial resource strain: Not on file  . Food insecurity    Worry: Not on file    Inability: Not on file  . Transportation needs    Medical: Not on file    Non-medical: Not on file  Tobacco Use  . Smoking status: Never Smoker  . Smokeless tobacco: Never Used  Substance and Sexual Activity  . Alcohol use: Yes    Alcohol/week: 0.0 standard drinks    Comment: rarely  . Drug use: Not on file  . Sexual activity: Not on file  Lifestyle  . Physical activity    Days per week: Not on file    Minutes per session: Not on file  . Stress: Not on file  Relationships  . Social Herbalist on phone: Not on file    Gets together: Not on file    Attends religious service: Not on file    Active member of club or organization: Not on file    Attends meetings of clubs or organizations: Not on file    Relationship status: Not on file  . Intimate partner violence    Fear of current or ex partner: Not on file    Emotionally abused: Not on file    Physically abused: Not on file    Forced sexual  activity: Not on file  Other Topics Concern  . Not on file  Social History Narrative   In a relationship.   3 children.   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   Moved to [place] from [place].   Enjoys going to the shooting range, paintball.      Constitutional: Denies fever, malaise, fatigue, headache or abrupt weight changes.  HEENT: Pt reports runny nose, loss of smell. Denies eye pain, eye redness, ear pain, ringing in the ears, wax buildup, nasal congestion, bloody nose, or sore throat. Respiratory: Pt reports cough. Denies difficulty breathing, shortness of breath, or sputum production.   Cardiovascular: Denies chest pain, chest tightness, palpitations or swelling in the hands or feet.  Gastrointestinal: Denies abdominal pain, bloating, constipation, diarrhea or blood in the stool.   No  other specific complaints in a complete review of systems (except as listed in HPI above).  Observations/Objective:   Wt Readings from Last 3 Encounters:  02/16/18 226 lb 4 oz (102.6 kg)  12/06/17 226 lb 4 oz (102.6 kg)  04/28/17 226 lb 12.8 oz (102.9 kg)    General: Appears his stated age, well developed, well nourished in NAD. HEENT: Head: normal shape and size;  Pulmonary/Chest: Normal effort. No respiratory distress.   Neurological: Alert and oriented.    BMET    Component Value Date/Time   NA 134 (L) 12/06/2017 1436   K 4.0 12/06/2017 1436   CL 99 12/06/2017 1436   CO2 27 12/06/2017 1436   GLUCOSE 258 (H) 12/06/2017 1436   BUN 16 12/06/2017 1436   CREATININE 1.25 12/06/2017 1436   CALCIUM 9.9 12/06/2017 1436   GFRNONAA >60 02/12/2015 1327   GFRAA >60 02/12/2015 1327    Lipid Panel     Component Value Date/Time   CHOL 168 12/06/2017 1436   TRIG 327.0 (H) 12/06/2017 1436   HDL 25.60 (L) 12/06/2017 1436   CHOLHDL 7 12/06/2017 1436   VLDL 65.4 (H) 12/06/2017 1436    CBC    Component Value Date/Time   WBC 7.7 02/12/2015 1327   RBC 4.80 02/12/2015 1327   HGB 14.3 02/12/2015 1327   HCT 41.6 02/12/2015 1327   PLT 140 (L) 02/12/2015 1327   MCV 86.7 02/12/2015 1327   MCH 29.8 02/12/2015 1327   MCHC 34.4 02/12/2015 1327   RDW 12.8 02/12/2015 1327    Hgb A1C Lab Results  Component Value Date   HGBA1C 8.9 (H) 12/06/2017       Assessment and Plan:  Cough, Runny Nose, Loss of Smell:  [facility] Novel Coronavirus testing Continue Ibuprofen, try Flonase and Delsym for symptoms Discussed the importance of hand washing, masking, social distancing and quarantine Work note provided ER precautions discussed  Return precautions discussed  Follow Up Instructions:    I discussed the assessment and treatment plan with the patient. The patient was provided an opportunity to ask questions and all were answered. The  patient agreed with the plan and demonstrated an understanding of the instructions.   The patient was advised to call back or seek an in-person evaluation if the symptoms worsen or if the condition fails to improve as anticipated.     Nicki Reaperegina Collie Kittel, NP

## 2019-03-26 NOTE — Patient Instructions (Signed)
COVID-19: How to Protect Yourself and Others Know how it spreads  There is currently no vaccine to prevent coronavirus disease 2019 (COVID-19).  The best way to prevent illness is to avoid being exposed to this virus.  The virus is thought to spread mainly from person-to-person. ? Between people who are in close contact with one another (within about 6 feet). ? Through respiratory droplets produced when an infected person coughs, sneezes or talks. ? These droplets can land in the mouths or noses of people who are nearby or possibly be inhaled into the lungs. ? Some recent studies have suggested that COVID-19 may be spread by people who are not showing symptoms. Everyone should Clean your hands often  Wash your hands often with soap and water for at least 20 seconds especially after you have been in a public place, or after blowing your nose, coughing, or sneezing.  If soap and water are not readily available, use a hand sanitizer that contains at least 60% alcohol. Cover all surfaces of your hands and rub them together until they feel dry.  Avoid touching your eyes, nose, and mouth with unwashed hands. Avoid close contact  Stay home if you are sick.  Avoid close contact with people who are sick.  Put distance between yourself and other people. ? Remember that some people without symptoms may be able to spread virus. ? This is especially important for people who are at higher risk of getting very sick.www.cdc.gov/coronavirus/2019-ncov/need-extra-precautions/people-at-higher-risk.html Cover your mouth and nose with a cloth face cover when around others  You could spread COVID-19 to others even if you do not feel sick.  Everyone should wear a cloth face cover when they have to go out in public, for example to the grocery store or to pick up other necessities. ? Cloth face coverings should not be placed on young children under age 2, anyone who has trouble breathing, or is unconscious,  incapacitated or otherwise unable to remove the mask without assistance.  The cloth face cover is meant to protect other people in case you are infected.  Do NOT use a facemask meant for a healthcare worker.  Continue to keep about 6 feet between yourself and others. The cloth face cover is not a substitute for social distancing. Cover coughs and sneezes  If you are in a private setting and do not have on your cloth face covering, remember to always cover your mouth and nose with a tissue when you cough or sneeze or use the inside of your elbow.  Throw used tissues in the trash.  Immediately wash your hands with soap and water for at least 20 seconds. If soap and water are not readily available, clean your hands with a hand sanitizer that contains at least 60% alcohol. Clean and disinfect  Clean AND disinfect frequently touched surfaces daily. This includes tables, doorknobs, light switches, countertops, handles, desks, phones, keyboards, toilets, faucets, and sinks. www.cdc.gov/coronavirus/2019-ncov/prevent-getting-sick/disinfecting-your-home.html  If surfaces are dirty, clean them: Use detergent or soap and water prior to disinfection.  Then, use a household disinfectant. You can see a list of EPA-registered household disinfectants here. cdc.gov/coronavirus 12/04/2018 This information is not intended to replace advice given to you by your health care provider. Make sure you discuss any questions you have with your health care provider. Document Released: 11/13/2018 Document Revised: 12/12/2018 Document Reviewed: 11/13/2018 Elsevier Patient Education  2020 Elsevier Inc.  

## 2019-03-27 ENCOUNTER — Telehealth: Payer: Self-pay | Admitting: Primary Care

## 2019-03-27 LAB — NOVEL CORONAVIRUS, NAA: SARS-CoV-2, NAA: DETECTED — AB

## 2019-03-27 NOTE — Telephone Encounter (Signed)
Patient stated that he was tested yesterday for Covid along with his girlfriend. She has received her lab results and he would like to know when he will be receiving his   Patient is requesting a call back

## 2019-03-27 NOTE — Telephone Encounter (Signed)
Spoken to patient and inform him that it take 2 to 5 days for results to come back. We do not know the order they process them. Inform patient to be patience, it was just done yesterday and will be resulted soon.

## 2019-04-10 ENCOUNTER — Telehealth: Payer: Self-pay

## 2019-04-10 NOTE — Telephone Encounter (Signed)
Continue to monitor closely- no work until totally better.  I don't think it is unusual for symptoms to come and go a bit at the end.  Get back on the cough medicine if needed. Drink lots of fluids and rest and keep [place] posted.  If worse or sob - call please and get to ER if needed

## 2019-04-10 NOTE — Telephone Encounter (Signed)
Done and in my chart Keep [place] posted

## 2019-04-10 NOTE — Telephone Encounter (Signed)
Pt notified of Dr. Marliss Coots instructions. Pt is asking that Dr. 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 Pt said she doesn't need to pick up the letter he just wants it done so he can print it off of mychart.

## 2019-04-10 NOTE — Telephone Encounter (Signed)
Pt left v/m that pt had virtual visit on 03/26/19; pt tested positive for covid on 03/28/19. Pt's symptoms were improving but pt worsening again with mild symptoms. I spoke with pt; pt's taste has come back but the pts smell has not returned. pts dry and hacky cough had almost gone away and now dry cough is more frequent.No H/A or no SOB and no fever. No other symptoms at this time. Pt stopped Dayquil for cough on 04/05/19 because the cough was getting better. Pt is still on quarantine. Pt cannot return to work until pt is symptom free for 24 hrs per pt. [facility] [place]. Pt request cb after reviewed by provider in office.

## 2019-04-10 NOTE — Telephone Encounter (Signed)
Pt.notified

## 2019-04-16 ENCOUNTER — Telehealth: Payer: Self-pay | Admitting: Primary Care

## 2019-04-16 NOTE — Telephone Encounter (Signed)
Maurice Henderson, will you fax over his form? I will place it in your inbox.

## 2019-04-16 NOTE — Telephone Encounter (Signed)
Place form in [place] for review and complete if necessary.

## 2019-04-16 NOTE — Telephone Encounter (Signed)
Pt dropped off form to be filled out by Allie Bossier. Form placed in [facility] tower for [place]. Pt would like for you to fax form to [phone].

## 2019-04-17 NOTE — Telephone Encounter (Signed)
Faxed as requested

## 2019-04-17 NOTE — Telephone Encounter (Signed)
Faxed requested.

## 2019-04-24 IMAGING — DX DG THORACIC SPINE 3V
3 series · 3 of 3 positions shown · non-contrast
Comparison: Chest radiograph 02/12/2015.

CLINICAL DATA: 40-year-old male with midthoracic back pain for 3
months. Recently progressive. Heavy lifting at work. No specific
injury.

EXAM:
THORACIC SPINE - 3 VIEWS

[t-spine ap]
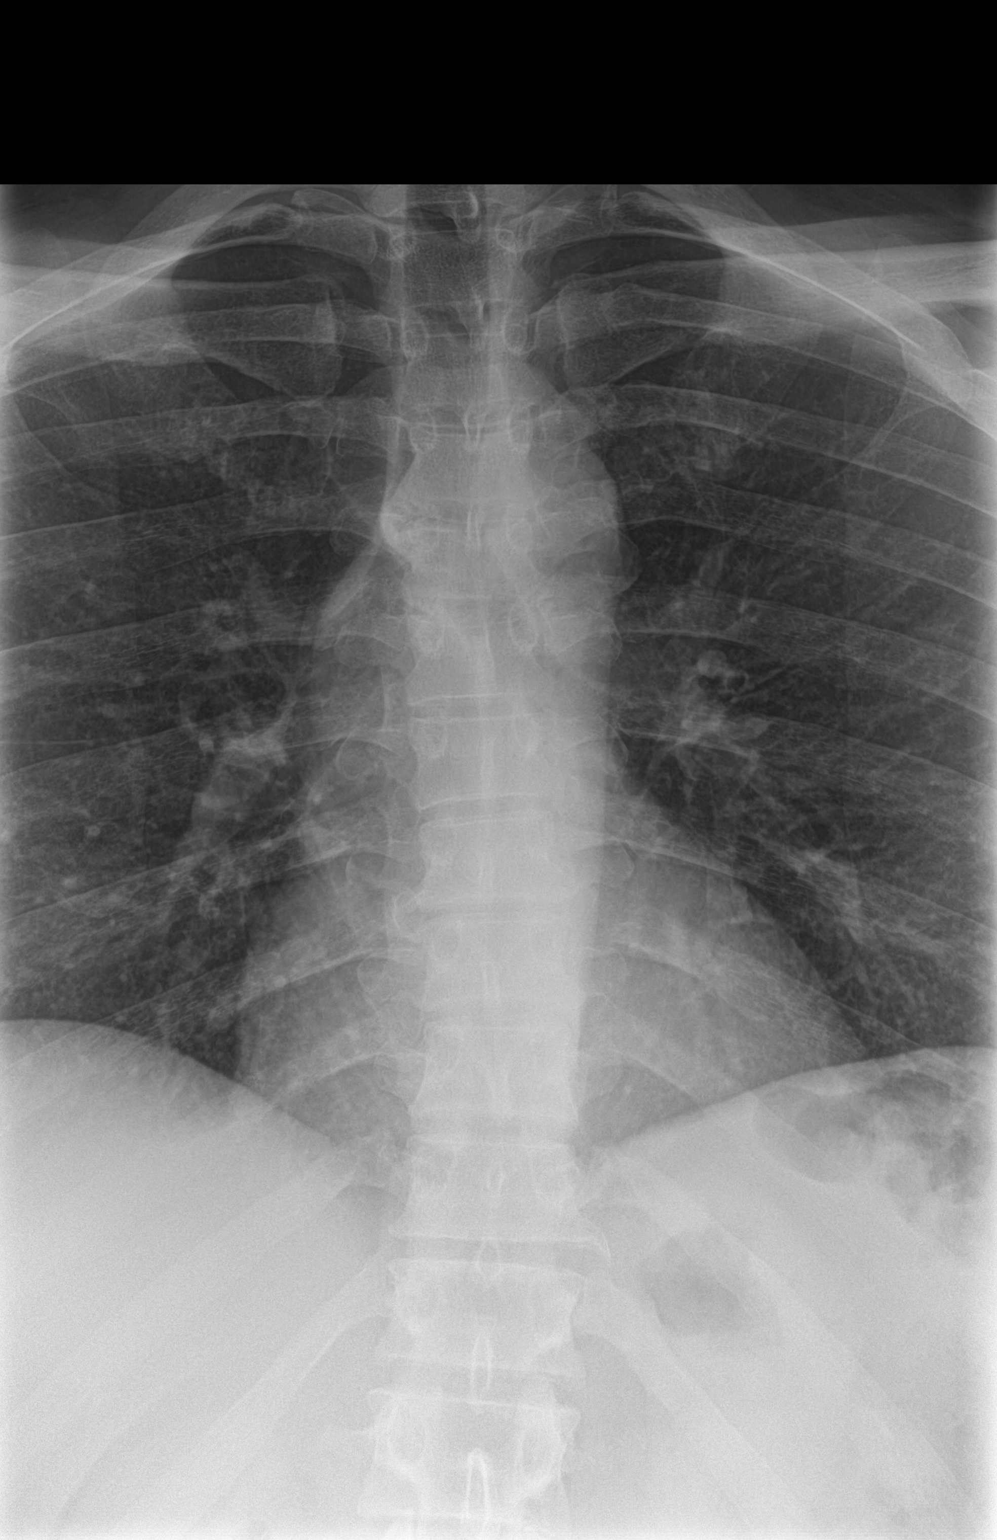

[t-spine lat]
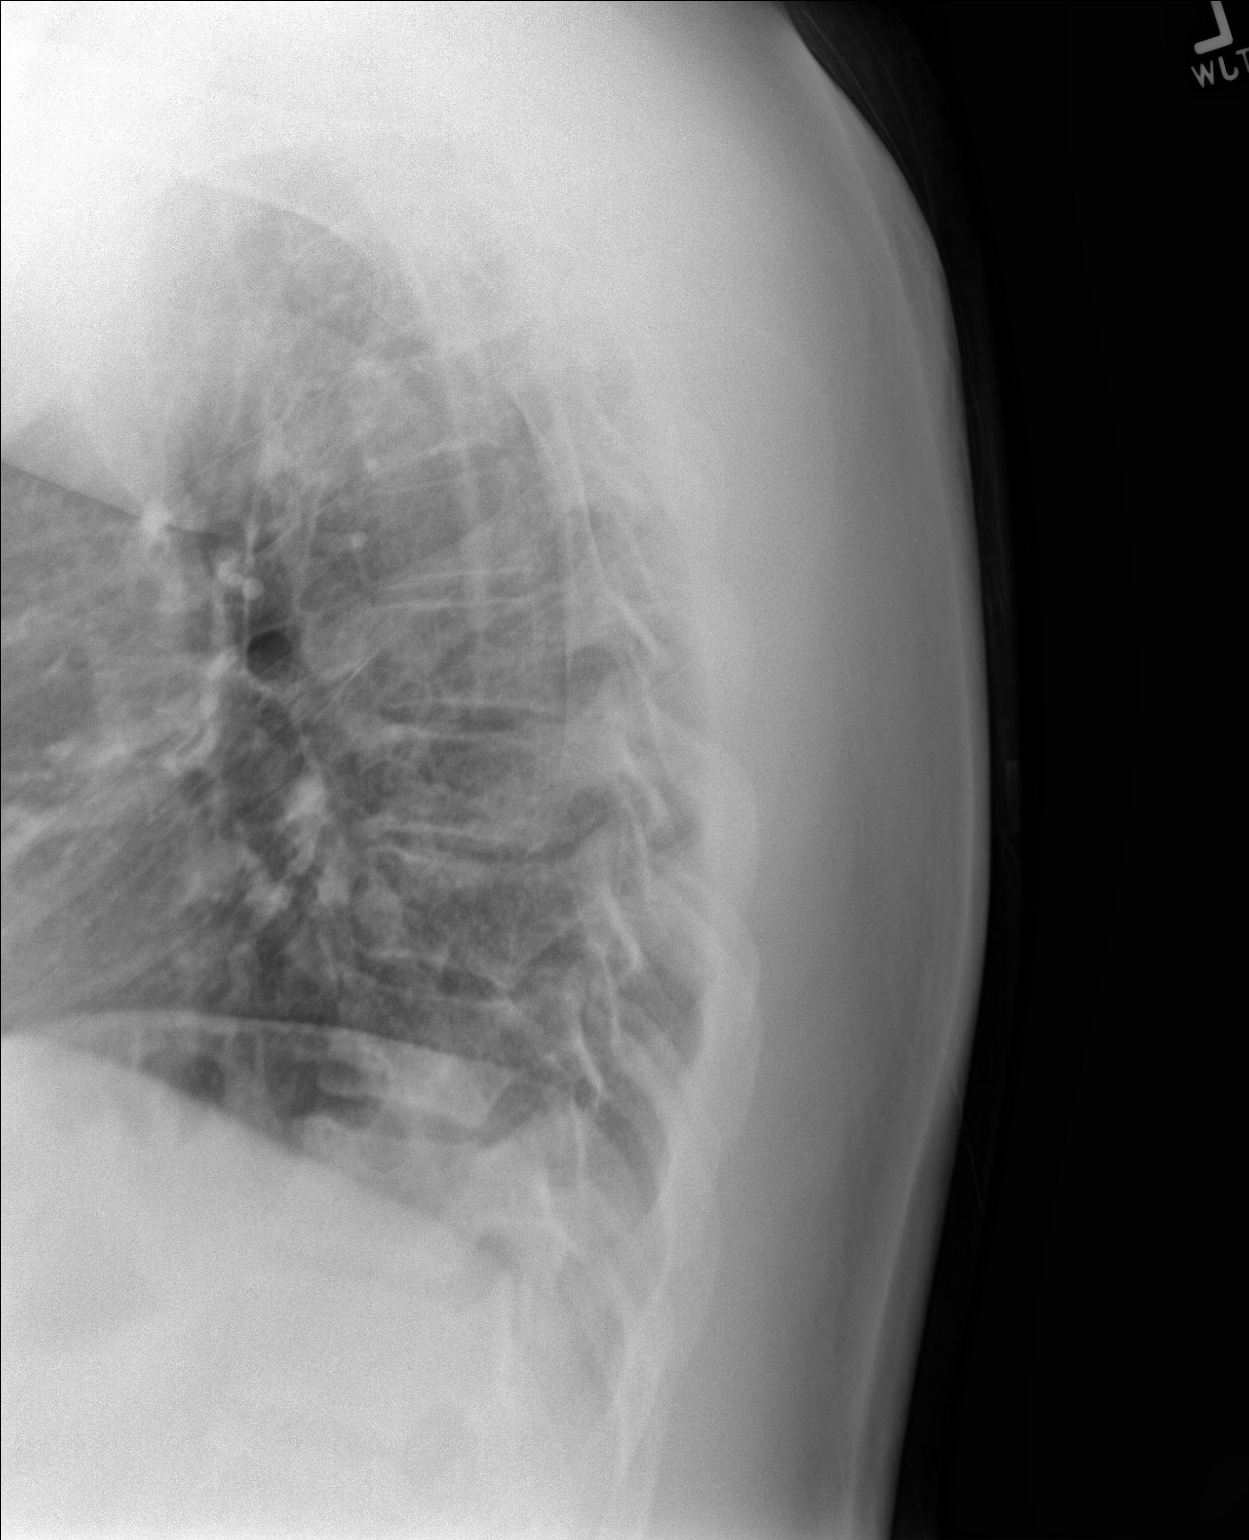

[t-spine swimmers]
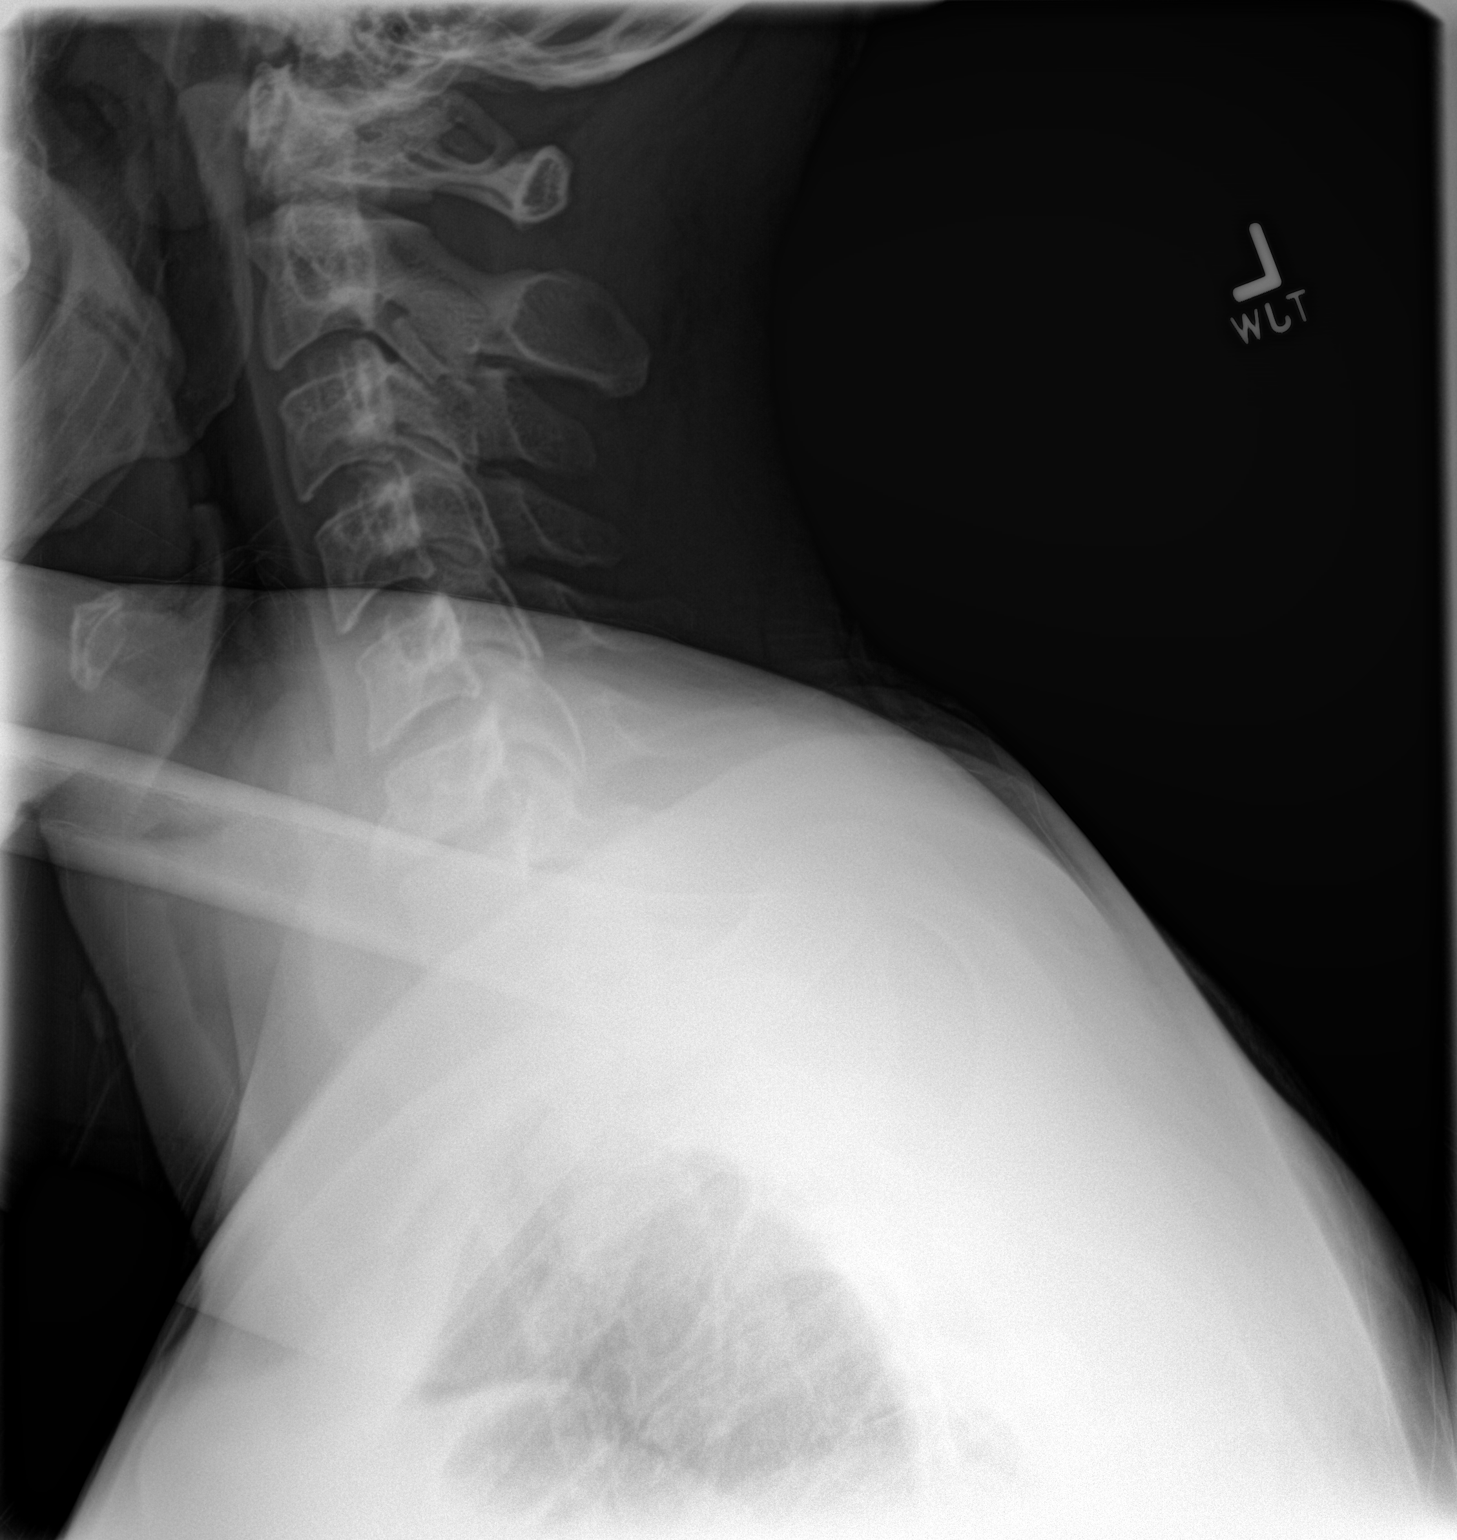

[3 of 3 positions shown; findings below may reference images not displayed]

FINDINGS: Bone mineralization is within normal limits. Normal thoracic
segmentation. Mild levoconvex thoracic scoliosis appears stable
since 8415. Normal thoracic vertebral height and alignment otherwise
with relatively preserved thoracic disc spaces. There is chronic
endplate spurring at T8-T9, and T11- T12. Cervicothoracic junction
alignment is within normal limits. Chronic disc space loss and
endplate spurring at C6-C7.

Stable and negative visualized thoracic visceral contours. Posterior
ribs appear intact.
IMPRESSION: 1. No acute osseous abnormality. Chronic mild levoconvex thoracic
scoliosis.
2. Evidence of chronic disc and/or endplate degeneration at C6-C7,
T8-T9, T11-T12.

## 2019-05-01 ENCOUNTER — Other Ambulatory Visit: Payer: Self-pay | Admitting: Primary Care

## 2019-05-01 DIAGNOSIS — I10 Essential (primary) hypertension: Secondary | ICD-10-CM

## 2019-07-14 ENCOUNTER — Other Ambulatory Visit: Payer: Self-pay

## 2019-07-14 DIAGNOSIS — I10 Essential (primary) hypertension: Secondary | ICD-10-CM

## 2019-07-16 MED ORDER — AMLODIPINE BESY-BENAZEPRIL HCL 10-40 MG PO CAPS: 1.0000 | ORAL_CAPSULE | Freq: Every day | ORAL | 0 refills | Status: DC

## 2019-08-12 ENCOUNTER — Other Ambulatory Visit: Payer: Self-pay | Admitting: Primary Care

## 2019-08-12 DIAGNOSIS — I10 Essential (primary) hypertension: Secondary | ICD-10-CM

## 2019-09-19 ENCOUNTER — Telehealth: Payer: Self-pay | Admitting: Primary Care

## 2019-09-19 DIAGNOSIS — I10 Essential (primary) hypertension: Secondary | ICD-10-CM

## 2019-09-30 MED ORDER — AMLODIPINE BESY-BENAZEPRIL HCL 10-40 MG PO CAPS: 1.0000 | ORAL_CAPSULE | Freq: Every day | ORAL | 0 refills | Status: DC

## 2019-09-30 NOTE — Addendum Note (Signed)
Addended by: Consuella Lose on: 09/30/2019 03:08 PM   Modules accepted: Orders

## 2019-09-30 NOTE — Telephone Encounter (Signed)
RX refilled. Patient advised.

## 2019-09-30 NOTE — Telephone Encounter (Addendum)
Patient scheduled follow up appointment with Jae Dire on 10/07/19.  Patient took his last pill yesterday and he said if he doesn't take the medication, he gets a headache.  Patient's requesting refill until he can come in for office visit.

## 2019-10-07 ENCOUNTER — Encounter: Payer: Self-pay | Admitting: Primary Care

## 2019-10-07 ENCOUNTER — Other Ambulatory Visit: Payer: Self-pay

## 2019-10-07 ENCOUNTER — Ambulatory Visit: Payer: BC Managed Care – PPO | Admitting: Primary Care

## 2019-10-07 VITALS — BP 134/86 | HR 82 | Temp 97.0°F | Ht 74.0 in | Wt 226.0 lb

## 2019-10-07 DIAGNOSIS — E119 Type 2 diabetes mellitus without complications: Secondary | ICD-10-CM | POA: Diagnosis not present

## 2019-10-07 DIAGNOSIS — I10 Essential (primary) hypertension: Secondary | ICD-10-CM | POA: Diagnosis not present

## 2019-10-07 DIAGNOSIS — E785 Hyperlipidemia, unspecified: Secondary | ICD-10-CM

## 2019-10-07 NOTE — Assessment & Plan Note (Signed)
Borderline high today, he is compliant to Lotrel. Continue same. CMP pending.

## 2019-10-07 NOTE — Progress Notes (Signed)
Subjective:    Patient ID: Maurice Henderson, male    DOB: 06-08-1976, 44 y.o.   MRN: [id number]  HPI  This visit occurred during the SARS-CoV-2 public health emergency.  Safety protocols were in place, including screening questions prior to the visit, additional usage of staff PPE, and extensive cleaning of exam room while observing appropriate contact time as indicated for disinfecting solutions.   Maurice Henderson is a 44 year old male with a history of hypertension, type 2 diabetes who presents today for follow up. He has not been seen by me since July 2019.  1) Type 2 Diabetes:  Current medications include: None  He is checking his blood glucose 0 times daily.   Last A1C: 8.9 in May 2019 Last Eye Exam: Completed in 2020, due again soon. Last Foot Exam: Due today. Pneumonia Vaccination: Declines  ACE/ARB: Benazepril  Statin: None  He is very active at work, walks over 10,000 steps four days weekly. He endorses a fair diet, does snack when he comes home from work. Mostly eats home cooked meals.   2) Essential Hypertension: Currently managed on amlodipine-benazepril 10-40 mg. He is not checking blood pressure at home. He denies chest pain, dizziness, headaches.   BP Readings from Last 3 Encounters:  10/07/19 134/86  02/16/18 122/82  12/06/17 (!) 144/92        Review of Systems  Eyes: Negative for visual disturbance.  Respiratory: Negative for shortness of breath.   Cardiovascular: Negative for chest pain.  Neurological: Negative for dizziness, numbness and headaches.       Past Medical History:  Diagnosis Date  . Allergy   . Arthritis   . Asthma    during childhood  . Hypertension      Social History   Socioeconomic History  . Marital status: Married    Spouse name: Not on file  . Number of children: Not on file  . Years of education: Not on file  . Highest education level: Not on file  Occupational History  . Not on file  Tobacco Use  . Smoking  status: Never Smoker  . Smokeless tobacco: Never Used  Substance and Sexual Activity  . Alcohol use: Yes    Alcohol/week: 0.0 standard drinks    Comment: rarely  . Drug use: Not on file  . Sexual activity: Not on file  Other Topics Concern  . Not on file  Social History Narrative   In a relationship.   3 children.   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   Moved to [place] from [place].   Enjoys going to the shooting range, paintball.    Social Determinants of Health   Financial Resource Strain:   . Difficulty of Paying Living Expenses: Not on file  Food Insecurity:   . Worried About Charity fundraiser in the Last Year: Not on file  . Ran Out of Food in the Last Year: Not on file  Transportation Needs:   . Lack of Transportation (Medical): Not on file  . Lack of Transportation (Non-Medical): Not on file  Physical Activity:   . Days of Exercise per Week: Not on file  . Minutes of Exercise per Session: Not on file  Stress:   . Feeling of Stress : Not on file  Social Connections:   . Frequency of Communication with Friends and Family: Not on file  . Frequency of Social Gatherings with Friends and Family: Not on file  . Attends Religious Services: Not on  file  . Active Member of Clubs or Organizations: Not on file  . Attends Banker Meetings: Not on file  . Marital Status: Not on file  Intimate Partner Violence:   . Fear of Current or Ex-Partner: Not on file  . Emotionally Abused: Not on file  . Physically Abused: Not on file  . Sexually Abused: Not on file    No past surgical history on file.  Family History  Problem Relation Age of Onset  . Arthritis Mother   . Arthritis Father   . Hyperlipidemia Father   . Hypertension Father   . Heart disease Father     No Known Allergies  Current Outpatient Medications on File Prior to Visit  Medication Sig Dispense Refill  . amLODipine-benazepril (LOTREL) 10-40 MG capsule Take 1 capsule by mouth daily. 30  capsule 0   No current facility-administered medications on file prior to visit.    BP 134/86   Pulse 82   Temp (!) 97 F (36.1 C) (Temporal)   Ht 6\' 2"  (1.88 m)   Wt 226 lb (102.5 kg)   SpO2 98%   BMI 29.02 kg/m    Objective:   Physical Exam  Constitutional: He appears well-nourished.  Cardiovascular: Normal rate and regular rhythm.  Respiratory: Effort normal and breath sounds normal.  Musculoskeletal:     Cervical back: Neck supple.  Skin: Skin is warm and dry.  Psychiatric: He has a normal mood and affect.           Assessment & Plan:

## 2019-10-07 NOTE — Assessment & Plan Note (Signed)
Has not been in for follow up since July 2019, A1C uncontrolled around that time.  He is no longer taking metformin due to GI upset. He is willing to take the XR version if needed.  Foot exam today. Declines pneumonia vaccination. Managed on ARB. Lipid panel pending.  Follow up in 3-6 months based off of A1C results.

## 2019-10-07 NOTE — Patient Instructions (Signed)
Stop by the lab prior to leaving today. I will notify you of your results once received.   It was a pleasure to see you today!  

## 2019-10-08 ENCOUNTER — Other Ambulatory Visit: Payer: Self-pay | Admitting: Primary Care

## 2019-10-08 DIAGNOSIS — E119 Type 2 diabetes mellitus without complications: Secondary | ICD-10-CM

## 2019-10-08 LAB — COMPREHENSIVE METABOLIC PANEL
ALT: 30 U/L (ref 0–53)
AST: 25 U/L (ref 0–37)
Albumin: 4.3 g/dL (ref 3.5–5.2)
Alkaline Phosphatase: 81 U/L (ref 39–117)
BUN: 15 mg/dL (ref 6–23)
CO2: 28 mEq/L (ref 19–32)
Calcium: 9.7 mg/dL (ref 8.4–10.5)
Chloride: 101 mEq/L (ref 96–112)
Creatinine, Ser: 1.19 mg/dL (ref 0.40–1.50)
GFR: 81.38 mL/min (ref >60.00)
Glucose, Bld: 271 mg/dL — ABNORMAL HIGH (ref 70–99)
Potassium: 4 mEq/L (ref 3.5–5.1)
Sodium: 134 mEq/L — ABNORMAL LOW (ref 135–145)
Total Bilirubin: 0.8 mg/dL (ref 0.2–1.2)
Total Protein: 7.3 g/dL (ref 6.0–8.3)

## 2019-10-08 LAB — LIPID PANEL
Cholesterol: 184 mg/dL (ref 0–200)
HDL: 31.8 mg/dL — ABNORMAL LOW (ref >39.00)
LDL Cholesterol: 117 mg/dL — ABNORMAL HIGH (ref 0–99)
NonHDL: 151.82
Total CHOL/HDL Ratio: 6
Triglycerides: 176 mg/dL — ABNORMAL HIGH (ref 0.0–149.0)
VLDL: 35.2 mg/dL (ref 0.0–40.0)

## 2019-10-08 LAB — HEMOGLOBIN A1C: Hgb A1c MFr Bld: 10.1 % — ABNORMAL HIGH (ref 4.6–6.5)

## 2019-10-08 MED ORDER — METFORMIN HCL ER 500 MG PO TB24: ORAL_TABLET | ORAL | 0 refills | Status: DC

## 2019-10-15 ENCOUNTER — Encounter: Payer: Self-pay | Admitting: *Deleted

## 2019-10-23 ENCOUNTER — Other Ambulatory Visit: Payer: Self-pay | Admitting: Primary Care

## 2019-10-23 DIAGNOSIS — I10 Essential (primary) hypertension: Secondary | ICD-10-CM

## 2019-10-30 ENCOUNTER — Ambulatory Visit: Payer: BC Managed Care – PPO | Attending: Internal Medicine

## 2019-10-30 DIAGNOSIS — Z23 Encounter for immunization: Secondary | ICD-10-CM

## 2019-10-30 NOTE — Progress Notes (Signed)
   Covid-19 Vaccination Clinic  Name:  [name]    MRN: [id number] DOB: September 15, 1975  10/30/2019  Mr. Thivierge was observed post Covid-19 immunization for 15 minutes without incident. He was provided with Vaccine Information Sheet and instruction to access the V-Safe system.   Mr. Manetta was instructed to call 911 with any severe reactions post vaccine: Marland Kitchen Difficulty breathing  . Swelling of face and throat  . A fast heartbeat  . A bad rash all over body  . Dizziness and weakness   Immunizations Administered    Name Date Dose VIS Date Route   Pfizer COVID-19 Vaccine 10/30/2019 11:42 AM 0.3 mL 07/12/2019 Intramuscular   Manufacturer: [employer], [employer]   Lot: [id number]   NDC: [address]-1

## 2019-11-24 ENCOUNTER — Ambulatory Visit: Payer: BC Managed Care – PPO | Attending: Internal Medicine

## 2019-11-24 DIAGNOSIS — Z23 Encounter for immunization: Secondary | ICD-10-CM

## 2019-11-24 NOTE — Progress Notes (Signed)
   Covid-19 Vaccination Clinic  Name:  Samwise Eckardt    MRN: [id number] DOB: [birth year]  11/24/2019  Mr. Prosch was observed post Covid-19 immunization for 15 minutes without incident. He was provided with Vaccine Information Sheet and instruction to access the V-Safe system.   Mr. Cohron was instructed to call 911 with any severe reactions post vaccine: Marland Kitchen Difficulty breathing  . Swelling of face and throat  . A fast heartbeat  . A bad rash all over body  . Dizziness and weakness   Immunizations Administered    Name Date Dose VIS Date Route   Pfizer COVID-19 Vaccine 11/24/2019  1:01 PM 0.3 mL 09/25/2018 Intramuscular   Manufacturer: [employer], [employer]   Lot: [id number]   NDC: [address]-4

## 2020-01-16 ENCOUNTER — Telehealth: Payer: Self-pay

## 2020-01-16 LAB — HM DIABETES EYE EXAM

## 2020-01-16 NOTE — Telephone Encounter (Signed)
Dr from [facility] called to do diabetic screening and needed all A1c and pt advised he recently started metformin and she wanted to know when pt started med. I advised 10/07/19 AIC 10.1                 12/06/17 A1C 8.9                 04/28/17 A1C 9.1                 02/19/15 A1C 5.3  and pt started metformin on current med list metformin 500 mg 24 h tab taking 1 tab bid with food for 1 wk then after that taking 2 tabs with food bid on 10/08/19. There was metformin listed on hx med list but Dr said pt said recently started so did not want the hx med list for metformin. Nothing else needed at this time and doctor appreciative. FYI to [place] CMA.

## 2020-01-17 NOTE — Telephone Encounter (Signed)
Noted  

## 2020-01-20 ENCOUNTER — Encounter: Payer: Self-pay | Admitting: Primary Care

## 2020-02-04 ENCOUNTER — Other Ambulatory Visit: Payer: Self-pay | Admitting: Primary Care

## 2020-02-04 DIAGNOSIS — I10 Essential (primary) hypertension: Secondary | ICD-10-CM

## 2020-02-22 ENCOUNTER — Other Ambulatory Visit: Payer: Self-pay | Admitting: Primary Care

## 2020-02-22 DIAGNOSIS — I10 Essential (primary) hypertension: Secondary | ICD-10-CM

## 2020-03-03 ENCOUNTER — Other Ambulatory Visit: Payer: Self-pay | Admitting: Primary Care

## 2020-03-03 DIAGNOSIS — I10 Essential (primary) hypertension: Secondary | ICD-10-CM

## 2020-03-03 MED ORDER — AMLODIPINE BESY-BENAZEPRIL HCL 10-40 MG PO CAPS: ORAL_CAPSULE | ORAL | 0 refills | Status: DC

## 2020-03-03 NOTE — Addendum Note (Signed)
Addended by: Shon Millet on: 03/03/2020 04:23 PM   Modules accepted: Orders

## 2020-03-03 NOTE — Telephone Encounter (Signed)
Pt called back and scheduled f/u appt., med refilled once

## 2020-03-20 ENCOUNTER — Ambulatory Visit: Payer: BC Managed Care – PPO | Admitting: Primary Care

## 2020-03-20 DIAGNOSIS — Z0289 Encounter for other administrative examinations: Secondary | ICD-10-CM

## 2020-03-25 ENCOUNTER — Other Ambulatory Visit: Payer: Self-pay | Admitting: Primary Care

## 2020-03-25 DIAGNOSIS — I10 Essential (primary) hypertension: Secondary | ICD-10-CM

## 2020-04-21 ENCOUNTER — Other Ambulatory Visit: Payer: Self-pay | Admitting: Primary Care

## 2020-04-21 ENCOUNTER — Telehealth: Payer: Self-pay

## 2020-04-21 DIAGNOSIS — I10 Essential (primary) hypertension: Secondary | ICD-10-CM

## 2020-04-21 NOTE — Telephone Encounter (Signed)
Received refill request from pharmacy for Amlodipine. Per last note patient was to follow up in 3 months. It has been 6 months from that visit. I have called and l/m to call office for appointment.

## 2020-04-23 ENCOUNTER — Encounter: Payer: Self-pay | Admitting: Primary Care

## 2020-04-23 ENCOUNTER — Ambulatory Visit: Payer: BC Managed Care – PPO | Admitting: Primary Care

## 2020-04-23 ENCOUNTER — Other Ambulatory Visit: Payer: Self-pay

## 2020-04-23 VITALS — BP 160/94 | HR 76 | Temp 97.6°F | Ht 74.0 in | Wt 222.0 lb

## 2020-04-23 DIAGNOSIS — Z114 Encounter for screening for human immunodeficiency virus [HIV]: Secondary | ICD-10-CM | POA: Diagnosis not present

## 2020-04-23 DIAGNOSIS — Z1159 Encounter for screening for other viral diseases: Secondary | ICD-10-CM

## 2020-04-23 DIAGNOSIS — I10 Essential (primary) hypertension: Secondary | ICD-10-CM

## 2020-04-23 DIAGNOSIS — Z9189 Other specified personal risk factors, not elsewhere classified: Secondary | ICD-10-CM

## 2020-04-23 DIAGNOSIS — E119 Type 2 diabetes mellitus without complications: Secondary | ICD-10-CM

## 2020-04-23 DIAGNOSIS — E785 Hyperlipidemia, unspecified: Secondary | ICD-10-CM | POA: Diagnosis not present

## 2020-04-23 DIAGNOSIS — Z23 Encounter for immunization: Secondary | ICD-10-CM | POA: Diagnosis not present

## 2020-04-23 LAB — POCT GLYCOSYLATED HEMOGLOBIN (HGB A1C): Hemoglobin A1C: 8.7 % — AB (ref 4.0–5.6)

## 2020-04-23 LAB — LIPID PANEL
Cholesterol: 165 mg/dL (ref 0–200)
HDL: 32.5 mg/dL — ABNORMAL LOW (ref >39.00)
LDL Cholesterol: 112 mg/dL — ABNORMAL HIGH (ref 0–99)
NonHDL: 132.72
Total CHOL/HDL Ratio: 5
Triglycerides: 103 mg/dL (ref 0.0–149.0)
VLDL: 20.6 mg/dL (ref 0.0–40.0)

## 2020-04-23 MED ORDER — AMLODIPINE BESY-BENAZEPRIL HCL 10-40 MG PO CAPS: ORAL_CAPSULE | ORAL | 3 refills | Status: DC

## 2020-04-23 MED ORDER — METFORMIN HCL ER 750 MG PO TB24: 1500.0000 mg | ORAL_TABLET | Freq: Every day | ORAL | 3 refills | Status: DC

## 2020-04-23 NOTE — Assessment & Plan Note (Signed)
Improved but still above goal with A1C today of 8.7. He is forgetting twice weekly, on average, to take his second dose, discussed to take both tablets together.  Will slightly increase his dose to 750 mg, take 2 tablets daily.   Pneumonia vaccination provided today. Will initiate statin therapy, await lipid panel results. Follow up in 3 months.

## 2020-04-23 NOTE — Progress Notes (Signed)
Subjective:    Patient ID: Maurice Henderson, male    DOB: 1975-08-27, 44 y.o.   MRN: [id number]  HPI  This visit occurred during the SARS-CoV-2 public health emergency.  Safety protocols were in place, including screening questions prior to the visit, additional usage of staff PPE, and extensive cleaning of exam room while observing appropriate contact time as indicated for disinfecting solutions.   [name] is a 44 year old male with a history of hypertension, type 2 diabetes who presents today for follow up.  1) Type 2 Diabetes:   Current medications include: Metformin XR 500 mg, and is taking one tablet twice daily, forgets several times weekly to take his second dose.   He is checking his blood glucose 0 times daily.  Last A1C: 10.1 in March 2021 Last Eye Exam: Completed and UTD Last Foot Exam: UTD Pneumonia Vaccination: Never completed  ACE/ARB: ACE-I Statin: None  2) Essential Hypertension: Currently managed on amlodipine-benazepril 10-40 mg. He ran out of his medication several weeks ago. He denies dizziness and headaches. He does not check his blood pressure at home.   BP Readings from Last 3 Encounters:  04/23/20 (!) 160/94  10/07/19 134/86  02/16/18 122/82     Review of Systems  Eyes: Negative for visual disturbance.  Respiratory: Negative for shortness of breath.   Cardiovascular: Negative for chest pain.  Neurological: Negative for dizziness, numbness and headaches.       Past Medical History:  Diagnosis Date  . Allergy   . Arthritis   . Asthma    during childhood  . Hypertension      Social History   Socioeconomic History  . Marital status: Married    Spouse name: Not on file  . Number of children: Not on file  . Years of education: Not on file  . Highest education level: Not on file  Occupational History  . Not on file  Tobacco Use  . Smoking status: Never Smoker  . Smokeless tobacco: Never Used  Substance and Sexual Activity  .  Alcohol use: Yes    Alcohol/week: 0.0 standard drinks    Comment: rarely  . Drug use: Not on file  . Sexual activity: Not on file  Other Topics Concern  . Not on file  Social History Narrative   In a relationship.   3 children.   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   Moved to [place] from [place].   Enjoys going to the shooting range, paintball.    Social Determinants of Health   Financial Resource Strain:   . Difficulty of Paying Living Expenses: Not on file  Food Insecurity:   . Worried About Programme researcher, broadcasting/film/video in the Last Year: Not on file  . Ran Out of Food in the Last Year: Not on file  Transportation Needs:   . Lack of Transportation (Medical): Not on file  . Lack of Transportation (Non-Medical): Not on file  Physical Activity:   . Days of Exercise per Week: Not on file  . Minutes of Exercise per Session: Not on file  Stress:   . Feeling of Stress : Not on file  Social Connections:   . Frequency of Communication with Friends and Family: Not on file  . Frequency of Social Gatherings with Friends and Family: Not on file  . Attends Religious Services: Not on file  . Active Member of Clubs or Organizations: Not on file  . Attends Banker Meetings: Not on  file  . Marital Status: Not on file  Intimate Partner Violence:   . Fear of Current or Ex-Partner: Not on file  . Emotionally Abused: Not on file  . Physically Abused: Not on file  . Sexually Abused: Not on file    History reviewed. No pertinent surgical history.  Family History  Problem Relation Age of Onset  . Arthritis Mother   . Arthritis Father   . Hyperlipidemia Father   . Hypertension Father   . Heart disease Father     No Known Allergies  No current outpatient medications on file prior to visit.   No current facility-administered medications on file prior to visit.    BP (!) 160/94   Pulse 76   Temp 97.6 F (36.4 C) (Temporal)   Ht 6\' 2"  (1.88 m)   Wt 222 lb (100.7 kg)    SpO2 98%   BMI 28.50 kg/m    Objective:   Physical Exam Cardiovascular:     Rate and Rhythm: Normal rate and regular rhythm.  Pulmonary:     Effort: Pulmonary effort is normal.     Breath sounds: Normal breath sounds.  Musculoskeletal:     Cervical back: Neck supple.  Skin:    General: Skin is warm and dry.            Assessment & Plan:

## 2020-04-23 NOTE — Telephone Encounter (Signed)
Patient seen in office today. 

## 2020-04-23 NOTE — Assessment & Plan Note (Signed)
Uncontrolled but has been out of medication for 2-3 weeks. Asymptomatic.  Long discussion today regarding compliance to medications and to notify if running low. Refills provided. He will monitor.

## 2020-04-23 NOTE — Patient Instructions (Signed)
Start metformin ER 750 mg for diabetes. Take 2 tablets by mouth every morning for diabetes.  Resume your amlodipine-benazepril 10-40 mg for blood pressure. Monitor your blood pressure and notify me if you see readings consistently at or above 135/90.  Stop by the lab prior to leaving today. I will notify you of your results once received.   We will be starting a cholesterol lowering medication, but will wait for your updated cholesterol levels to return.  Please schedule a follow up appointment in 3 months for diabetes and blood pressure check.  It was a pleasure to see you today!

## 2020-04-24 ENCOUNTER — Other Ambulatory Visit: Payer: Self-pay | Admitting: Primary Care

## 2020-04-24 DIAGNOSIS — E785 Hyperlipidemia, unspecified: Secondary | ICD-10-CM

## 2020-04-24 LAB — HEPATITIS C ANTIBODY
Hepatitis C Ab: NONREACTIVE
SIGNAL TO CUT-OFF: 0.01

## 2020-04-24 LAB — HIV ANTIBODY (ROUTINE TESTING W REFLEX): HIV 1&2 Ab, 4th Generation: NONREACTIVE

## 2020-04-24 MED ORDER — ROSUVASTATIN CALCIUM 5 MG PO TABS: 5.0000 mg | ORAL_TABLET | Freq: Every day | ORAL | 3 refills | Status: DC

## 2020-08-06 ENCOUNTER — Ambulatory Visit: Payer: BC Managed Care – PPO | Admitting: Primary Care

## 2020-08-06 DIAGNOSIS — Z0289 Encounter for other administrative examinations: Secondary | ICD-10-CM

## 2021-01-15 ENCOUNTER — Other Ambulatory Visit: Payer: Self-pay

## 2021-01-15 ENCOUNTER — Emergency Department
Admission: EM | Admit: 2021-01-15 | Discharge: 2021-01-15 | Disposition: A | Discharge status: Home / Self Care | Payer: BC Managed Care – PPO | Attending: Emergency Medicine | Admitting: Emergency Medicine

## 2021-01-15 ENCOUNTER — Emergency Department: Payer: BC Managed Care – PPO

## 2021-01-15 ENCOUNTER — Encounter: Payer: Self-pay | Admitting: Emergency Medicine

## 2021-01-15 DIAGNOSIS — E119 Type 2 diabetes mellitus without complications: Secondary | ICD-10-CM | POA: Diagnosis not present

## 2021-01-15 DIAGNOSIS — W231XXA Caught, crushed, jammed, or pinched between stationary objects, initial encounter: Secondary | ICD-10-CM | POA: Insufficient documentation

## 2021-01-15 DIAGNOSIS — Z7984 Long term (current) use of oral hypoglycemic drugs: Secondary | ICD-10-CM | POA: Insufficient documentation

## 2021-01-15 DIAGNOSIS — S62524A Nondisplaced fracture of distal phalanx of right thumb, initial encounter for closed fracture: Secondary | ICD-10-CM | POA: Diagnosis not present

## 2021-01-15 DIAGNOSIS — Z79899 Other long term (current) drug therapy: Secondary | ICD-10-CM | POA: Insufficient documentation

## 2021-01-15 DIAGNOSIS — J45909 Unspecified asthma, uncomplicated: Secondary | ICD-10-CM | POA: Diagnosis not present

## 2021-01-15 DIAGNOSIS — I10 Essential (primary) hypertension: Secondary | ICD-10-CM | POA: Diagnosis not present

## 2021-01-15 DIAGNOSIS — S6710XA Crushing injury of unspecified finger(s), initial encounter: Secondary | ICD-10-CM

## 2021-01-15 DIAGNOSIS — S6991XA Unspecified injury of right wrist, hand and finger(s), initial encounter: Secondary | ICD-10-CM | POA: Diagnosis present

## 2021-01-15 NOTE — ED Notes (Signed)
ED Provider at bedside. 

## 2021-01-15 NOTE — Discharge Instructions (Addendum)
Please keep this thumb elevated above the level of your heart is much as possible.  Please use ice for the next 24 hours 10 minutes on/10 minutes off intervals.  Please use Tylenol/NSAIDs (ibuprofen, naproxen) for any continued pain

## 2021-01-15 NOTE — ED Triage Notes (Signed)
Patient ambulatory to triage with steady gait, without difficulty or distress noted ; pt reports shutting rt thumb in car door around midnight; c/o pain since

## 2021-01-15 NOTE — ED Provider Notes (Signed)
[facility] Emergency Department Provider Note   ____________________________________________   Event Date/Time   First MD Initiated Contact with Patient 01/15/21 0421     (approximate)  I have reviewed the triage vital signs and the nursing notes.   HISTORY  Chief Complaint Finger Injury    HPI Brendan Hyland is a 45 y.o. male with the below stated past medical history who presents for right thumb pain after closing in a car door approximately 6 hours prior to arrival.  Patient describes pain to the medial aspect of of the right thumb with point tenderness at the proximal aspect of the distal phalange.  Patient states 8/10 pain in this area that radiates up into his hand and is worse with movement at that joint or palpation.  Patient is partially relieved with rest and NSAIDs.  Patient denies any other trauma, decreased sensation to this finger, or color change.  Patient does endorse associated subungual hematoma         Past Medical History:  Diagnosis Date   Allergy    Arthritis    Asthma    during childhood   Hypertension     Patient Active Problem List   Diagnosis Date Noted   Hyperlipidemia 04/23/2020   Difficulty concentrating 02/16/2018   Preventative health care 04/28/2017   Chronic midline thoracic back pain 04/28/2017   Rash and nonspecific skin eruption 10/29/2015   Allergic rhinitis 10/08/2015   Type 2 diabetes mellitus (HCC) 02/19/2015   Essential hypertension 02/11/2015    History reviewed. No pertinent surgical history.  Prior to Admission medications   Medication Sig Start Date End Date Taking? Authorizing Provider  amLODipine-benazepril (LOTREL) 10-40 MG capsule TAKE 1 CAPSULE BY MOUTH EVERY DAY for blood pressure. 04/23/20   Doreene Nest, NP  metFORMIN (GLUCOPHAGE-XR) 750 MG 24 hr tablet Take 2 tablets (1,500 mg total) by mouth daily with breakfast. For diabetes. 04/23/20   Doreene Nest, NP  rosuvastatin  (CRESTOR) 5 MG tablet Take 1 tablet (5 mg total) by mouth daily. For cholesterol. 04/24/20   Doreene Nest, NP    Allergies Patient has no known allergies.  Family History  Problem Relation Age of Onset   Arthritis Mother    Arthritis Father    Hyperlipidemia Father    Hypertension Father    Heart disease Father     Social History Social History   Tobacco Use   Smoking status: Never   Smokeless tobacco: Never  Vaping Use   Vaping Use: Never used  Substance Use Topics   Alcohol use: Yes    Alcohol/week: 0.0 standard drinks    Comment: rarely    Review of Systems Constitutional: No fever/chills Eyes: No visual changes. ENT: No sore throat. Cardiovascular: Denies chest pain. Respiratory: Denies shortness of breath. Gastrointestinal: No abdominal pain.  No nausea, no vomiting.  No diarrhea. Genitourinary: Negative for dysuria. Musculoskeletal: Positive for acute right thumb pain Skin: Negative for rash. Neurological: Negative for headaches, weakness/numbness/paresthesias in any extremity Psychiatric: Negative for suicidal ideation/homicidal ideation   ____________________________________________   PHYSICAL EXAM:  VITAL SIGNS: ED Triage Vitals  Enc Vitals Group     BP 01/15/21 0257 (!) 164/100     Pulse Rate 01/15/21 0257 83     Resp 01/15/21 0257 18     Temp 01/15/21 0257 97.9 F (36.6 C)     Temp Source 01/15/21 0257 Oral     SpO2 01/15/21 0257 99 %     Weight  01/15/21 0253 207 lb (93.9 kg)     Height 01/15/21 0253 6\' 2"  (1.88 m)     Head Circumference --      Peak Flow --      Pain Score 01/15/21 0253 8     Pain Loc --      Pain Edu? --      Excl. in GC? --    Constitutional: Alert and oriented. Well appearing and in no acute distress. Eyes: Conjunctivae are normal. PERRL. Head: Atraumatic. Nose: No congestion/rhinnorhea. Mouth/Throat: Mucous membranes are moist. Neck: No stridor Cardiovascular: Grossly normal heart sounds.  Good peripheral  circulation. Respiratory: Normal respiratory effort.  No retractions. Gastrointestinal: Soft and nontender. No distention. Musculoskeletal: Swelling, tenderness palpation, subungual hematoma to the right thumb Neurologic:  Normal speech and language. No gross focal neurologic deficits are appreciated. Skin:  Skin is warm and dry. No rash noted. Psychiatric: Mood and affect are normal. Speech and behavior are normal.  ____________________________________________   LABS (all labs ordered are listed, but only abnormal results are displayed)  Labs Reviewed - No data to display  RADIOLOGY  ED MD interpretation: X-ray of the right thumb shows a nondisplaced intra-articular fracture of the first distal phalanx of her right thumb  Official radiology report(s): DG Finger Thumb Right  Result Date: 01/15/2021 CLINICAL DATA:  Shut in car door EXAM: RIGHT THUMB 2+V COMPARISON:  None. FINDINGS: Conspicuous lucency likely reflecting a nondisplaced fracture through the base of the first distal phalanx with intra-articular extension. Punctate radiodensity seen in the volar soft tissues, could reflect debris on the skin surface. Circumferential swelling of the tip of the first digit. No other acute or worrisome osseous abnormality. IMPRESSION: Lucency through base of the first distal phalanx likely reflecting a nondisplaced intra-articular fracture. Mild swelling of the first digit. Punctate radiodensity along the medial volar soft tissues of the first digit, likely debris or punctate foreign body. Electronically Signed   By: 01/17/2021 M.D.   On: 01/15/2021 03:23    ____________________________________________   PROCEDURES  Procedure(s) performed (including Critical Care):  .Ortho Injury Treatment  Date/Time: 01/15/2021 5:24 AM Performed by: 01/17/2021, MD Authorized by: Merwyn Katos, MD   Consent:    Consent obtained:  Verbal   Consent given by:  Patient   Risks discussed:   Fracture   Alternatives discussed:  No treatment and alternative treatmentInjury location: finger Location details: right thumb Injury type: fracture Fracture type: distal phalanx MCP joint involved: no IP joint involved: no Pre-procedure neurovascular assessment: neurovascularly intact Pre-procedure distal perfusion: normal Pre-procedure neurological function: normal  Anesthesia: Local anesthesia used: no  Patient sedated: NoImmobilization: splint Splint type: static finger Splint Applied by: ED Provider Supplies used: aluminum splint Post-procedure neurovascular assessment: post-procedure neurovascularly intact Post-procedure distal perfusion: normal Post-procedure neurological function: normal     ____________________________________________   INITIAL IMPRESSION / ASSESSMENT AND PLAN / ED COURSE  As part of my medical decision making, I reviewed the following data within the electronic MEDICAL RECORD NUMBER Nursing notes reviewed and incorporated, Labs reviewed, EKG interpreted, Old chart reviewed, Radiograph reviewed and Notes from prior ED visits reviewed and incorporated        46 year old male with above-stated past medical history presents for right thumb pain after closing a door prior to arrival. Given history, exam and workup I have low suspicion for dislocation, significant ligamentous injury, septic arthritis, gout flare, new autoimmune arthropathy, or gonococcal arthropathy.  Interventions: Right hand x-ray shows nondisplaced intra-articular fracture  of the right thumb Patient splinted prior to discharge and given strict return precautions Disposition: Discharge home with strict return precautions and instructions for prompt primary care follow up in the next week.      ____________________________________________   FINAL CLINICAL IMPRESSION(S) / ED DIAGNOSES  Final diagnoses:  Crushing injury of finger, initial encounter  Nondisplaced fracture of distal  phalanx of right thumb, initial encounter for closed fracture     ED Discharge Orders     None        Note:  This document was prepared using Dragon voice recognition software and may include unintentional dictation errors.    Merwyn Katos, MD 01/15/21 [phone]

## 2021-06-06 ENCOUNTER — Other Ambulatory Visit: Payer: Self-pay | Admitting: Primary Care

## 2021-06-06 DIAGNOSIS — I10 Essential (primary) hypertension: Secondary | ICD-10-CM

## 2021-07-04 ENCOUNTER — Other Ambulatory Visit: Payer: Self-pay | Admitting: Primary Care

## 2021-07-04 DIAGNOSIS — I10 Essential (primary) hypertension: Secondary | ICD-10-CM

## 2021-08-05 ENCOUNTER — Other Ambulatory Visit: Payer: Self-pay | Admitting: Primary Care

## 2021-08-05 DIAGNOSIS — I10 Essential (primary) hypertension: Secondary | ICD-10-CM

## 2021-08-09 ENCOUNTER — Other Ambulatory Visit: Payer: Self-pay | Admitting: Primary Care

## 2021-08-09 DIAGNOSIS — I10 Essential (primary) hypertension: Secondary | ICD-10-CM

## 2021-09-21 ENCOUNTER — Encounter: Payer: Self-pay | Admitting: Primary Care

## 2021-09-21 ENCOUNTER — Ambulatory Visit (INDEPENDENT_AMBULATORY_CARE_PROVIDER_SITE_OTHER): Payer: 59 | Admitting: Primary Care

## 2021-09-21 ENCOUNTER — Other Ambulatory Visit: Payer: Self-pay

## 2021-09-21 VITALS — BP 170/102 | HR 98 | Temp 98.3°F | Ht 74.0 in | Wt 223.0 lb

## 2021-09-21 DIAGNOSIS — E1165 Type 2 diabetes mellitus with hyperglycemia: Secondary | ICD-10-CM

## 2021-09-21 DIAGNOSIS — E785 Hyperlipidemia, unspecified: Secondary | ICD-10-CM | POA: Diagnosis not present

## 2021-09-21 DIAGNOSIS — I10 Essential (primary) hypertension: Secondary | ICD-10-CM

## 2021-09-21 LAB — LIPID PANEL
Cholesterol: 183 mg/dL (ref 0–200)
HDL: 35.2 mg/dL — ABNORMAL LOW (ref >39.00)
NonHDL: 147.59
Total CHOL/HDL Ratio: 5
Triglycerides: 250 mg/dL — ABNORMAL HIGH (ref 0.0–149.0)
VLDL: 50 mg/dL — ABNORMAL HIGH (ref 0.0–40.0)

## 2021-09-21 LAB — CBC
HCT: 47.2 % (ref 39.0–52.0)
Hemoglobin: 15.8 g/dL (ref 13.0–17.0)
MCHC: 33.5 g/dL (ref 30.0–36.0)
MCV: 87.6 fl (ref 78.0–100.0)
Platelets: 163 10*3/uL (ref 150.0–400.0)
RBC: 5.39 Mil/uL (ref 4.22–5.81)
RDW: 12.8 % (ref 11.5–15.5)
WBC: 7.1 10*3/uL (ref 4.0–10.5)

## 2021-09-21 LAB — COMPREHENSIVE METABOLIC PANEL
ALT: 29 U/L (ref 0–53)
AST: 24 U/L (ref 0–37)
Albumin: 4.5 g/dL (ref 3.5–5.2)
Alkaline Phosphatase: 93 U/L (ref 39–117)
BUN: 15 mg/dL (ref 6–23)
CO2: 30 mEq/L (ref 19–32)
Calcium: 9.8 mg/dL (ref 8.4–10.5)
Chloride: 97 mEq/L (ref 96–112)
Creatinine, Ser: 1.27 mg/dL (ref 0.40–1.50)
GFR: 69.29 mL/min (ref >60.00)
Glucose, Bld: 409 mg/dL — ABNORMAL HIGH (ref 70–99)
Potassium: 4.2 mEq/L (ref 3.5–5.1)
Sodium: 133 mEq/L — ABNORMAL LOW (ref 135–145)
Total Bilirubin: 0.8 mg/dL (ref 0.2–1.2)
Total Protein: 7.6 g/dL (ref 6.0–8.3)

## 2021-09-21 LAB — POCT GLYCOSYLATED HEMOGLOBIN (HGB A1C): Hemoglobin A1C: 11.1 % — AB (ref 4.0–5.6)

## 2021-09-21 LAB — LDL CHOLESTEROL, DIRECT: Direct LDL: 109 mg/dL

## 2021-09-21 LAB — MICROALBUMIN / CREATININE URINE RATIO
Creatinine,U: 67.7 mg/dL
Microalb Creat Ratio: 19.5 mg/g (ref 0.0–30.0)
Microalb, Ur: 13.2 mg/dL — ABNORMAL HIGH (ref 0.0–1.9)

## 2021-09-21 MED ORDER — ROSUVASTATIN CALCIUM 5 MG PO TABS: 5.0000 mg | ORAL_TABLET | Freq: Every day | ORAL | 3 refills | Status: DC

## 2021-09-21 MED ORDER — AMLODIPINE BESY-BENAZEPRIL HCL 10-40 MG PO CAPS: ORAL_CAPSULE | ORAL | 0 refills | Status: DC

## 2021-09-21 MED ORDER — GLIPIZIDE ER 10 MG PO TB24: 10.0000 mg | ORAL_TABLET | Freq: Every day | ORAL | 1 refills | Status: DC

## 2021-09-21 NOTE — Assessment & Plan Note (Signed)
Uncontrolled.  Non compliant to follow-up, therefore has run out of medication.  Refills provided today.  Resume amlodipine-benazepril 10-40 mg daily. CMP pending.  Follow-up in 2 3 weeks for blood pressure check.

## 2021-09-21 NOTE — Patient Instructions (Signed)
Resume amlodipine-benazepril 10-40 mg daily for blood pressure. Resume rosuvastatin 5 mg daily for cholesterol.  Start glipizide XL 5 mg once daily with breakfast for diabetes.  Start checking your blood sugar levels.  Appropriate times to check your blood sugar levels are:  -Before any meal (breakfast, lunch, dinner) -Two hours after any meal (breakfast, lunch, dinner) -Bedtime  Record your readings and bring them to your next visit.  Schedule your eye exam.  Stop by the lab prior to leaving today. I will notify you of your results once received.   It was a pleasure to see you today!

## 2021-09-21 NOTE — Progress Notes (Signed)
Subjective:    Patient ID: Maurice Henderson, male    DOB: 08/25/1975, 46 y.o.   MRN: [id number]  HPI  Maurice Henderson is a very pleasant 46 y.o. male with a history of hypertension, type 2 diabetes, chronic back pain, hyperlipidemia who presents today follow-up of chronic conditions.  He has not been seen since September 2021 despite recommendations to return sooner.  1) Type 2 Diabetes:  Current medications include: Metformin XR 1500 mg daily.   He stopped taking Metformin last year as it caused diarrhea and upset stomach.  He did not notify us that he discontinued use of his medication.  He is checking his blood glucose 0 times daily.   Last A1C: 8.7 in September 2021, 11.1 today. Last Eye Exam: Due, he will schedule.  Last Foot Exam: Due Pneumonia Vaccination: 2021 Urine Microalbumin: None. Managed on ACE-I but has been off for months. Statin: rosuvastatin   Dietary changes since last visit: None. Poor diet, mostly eating cereal, pasta, rice, potatoes, bread. Occasionally sweets. He mostly drinks sugar free flavored water, was drinking soda.    Exercise: No regular exercise.   2) Essential Hypertension: Currently prescribed amlodipine-benazepril 10-40 mg daily.  He ran out of his medication a few months ago.  He has noticed headaches, pressure behind his eyes. He denies chest pain, dizziness. He had no problems with his prescribed regimen.   BP Readings from Last 3 Encounters:  09/21/21 (!) 170/102  01/15/21 (!) 164/100  04/23/20 (!) 160/94   3) Hyperlipidemia: Currently prescribed rosuvastatin 5 mg daily.  He ran out of his medication a few months ago.   Review of Systems  Eyes:  Negative for visual disturbance.  Respiratory:  Negative for shortness of breath.   Cardiovascular:  Negative for chest pain.  Endocrine: Positive for polydipsia. Negative for polyphagia and polyuria.  Neurological:  Positive for headaches. Negative for dizziness and numbness.         Past Medical History:  Diagnosis Date   Allergy    Arthritis    Asthma    during childhood   Hypertension     Social History   Socioeconomic History   Marital status: Married    Spouse name: Not on file   Number of children: Not on file   Years of education: Not on file   Highest education level: Not on file  Occupational History   Not on file  Tobacco Use   Smoking status: Never   Smokeless tobacco: Never  Vaping Use   Vaping Use: Never used  Substance and Sexual Activity   Alcohol use: Yes    Alcohol/week: 0.0 standard drinks    Comment: rarely   Drug use: Not on file   Sexual activity: Not on file  Other Topics Concern   Not on file  Social History Narrative   In a relationship.   3 children.   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   Moved to [place] from [place].   Enjoys going to the shooting range, paintball.    Social Determinants of Health   Financial Resource Strain: Not on file  Food Insecurity: Not on file  Transportation Needs: Not on file  Physical Activity: Not on file  Stress: Not on file  Social Connections: Not on file  Intimate Partner Violence: Not on file    History reviewed. No pertinent surgical history.  Family History  Problem Relation Age of Onset   Arthritis Mother    Arthritis Father  Hyperlipidemia Father    Hypertension Father    Heart disease Father     Allergies  Allergen Reactions   Metformin And Related Diarrhea    Current Outpatient Medications on File Prior to Visit  Medication Sig Dispense Refill   amLODipine-benazepril (LOTREL) 10-40 MG capsule TAKE 1 CAPSULE BY MOUTH EVERY DAY for blood pressure. Office visit required for further refills. (Patient not taking: Reported on 09/21/2021) 30 capsule 0   metFORMIN (GLUCOPHAGE-XR) 750 MG 24 hr tablet Take 2 tablets (1,500 mg total) by mouth daily with breakfast. For diabetes. (Patient not taking: Reported on 09/21/2021) 180 tablet 3   rosuvastatin (CRESTOR) 5  MG tablet Take 1 tablet (5 mg total) by mouth daily. For cholesterol. (Patient not taking: Reported on 09/21/2021) 90 tablet 3   No current facility-administered medications on file prior to visit.    BP (!) 170/102    Pulse 98    Temp 98.3 F (36.8 C) (Oral)    Ht 6\' 2"  (1.88 m)    Wt 223 lb (101.2 kg)    SpO2 96%    BMI 28.63 kg/m  Objective:   Physical Exam Cardiovascular:     Rate and Rhythm: Normal rate and regular rhythm.  Pulmonary:     Effort: Pulmonary effort is normal.     Breath sounds: Normal breath sounds. No wheezing or rales.  Musculoskeletal:     Cervical back: Neck supple.  Skin:    General: Skin is warm and dry.  Neurological:     Mental Status: He is alert and oriented to person, place, and time.  Psychiatric:        Mood and Affect: Mood normal.          Assessment & Plan:      This visit occurred during the SARS-CoV-2 public health emergency.  Safety protocols were in place, including screening questions prior to the visit, additional usage of staff PPE, and extensive cleaning of exam room while observing appropriate contact time as indicated for disinfecting solutions.

## 2021-09-21 NOTE — Assessment & Plan Note (Signed)
Noncompliant to follow-up, therefore has been out of rosuvastatin 5 mg for months.  Resume rosuvastatin 5 mg daily. Repeat lipid panel pending. We will likely need to repeat lipids again in 3 months

## 2021-09-21 NOTE — Assessment & Plan Note (Signed)
Uncontrolled with A1c of 11.1 today.  Noncompliant to follow-up and medication regimen, also did not notify us of his intolerance to metformin.  We will remove metformin from his medication list. Prescription for glipizide XL 10 mg sent to pharmacy for him to take once daily.  Strongly advised he work on his diet and start checking glucose levels.  We will plan to see him back in a few weeks for blood pressure follow-up, we will also perform a random glucose check at that point.  Consider addition of Januvia versus Trulicity if warranted.  Urine microalbumin pending. We will resume statin. He will schedule an eye exam. Foot exam next visit. Pneumonia vaccine up-to-date.

## 2021-10-12 ENCOUNTER — Ambulatory Visit: Payer: 59 | Admitting: Primary Care

## 2021-12-22 ENCOUNTER — Encounter: Payer: 59 | Admitting: Primary Care

## 2022-01-14 ENCOUNTER — Other Ambulatory Visit: Payer: Self-pay | Admitting: Primary Care

## 2022-01-14 DIAGNOSIS — I10 Essential (primary) hypertension: Secondary | ICD-10-CM

## 2022-12-30 ENCOUNTER — Other Ambulatory Visit: Payer: Self-pay | Admitting: Primary Care

## 2022-12-30 DIAGNOSIS — I10 Essential (primary) hypertension: Secondary | ICD-10-CM

## 2022-12-30 NOTE — Telephone Encounter (Signed)
Patient is overdue for follow up of diabetes and high blood pressure, this will be required prior to any further refills.  Please schedule, thank you!

## 2023-01-02 NOTE — Telephone Encounter (Signed)
LVM for patient to c/b and schedule.  

## 2023-01-11 IMAGING — CR DG FINGER THUMB 2+V*R*
3 series · 3 of 3 positions shown · non-contrast
Comparison: None.

CLINICAL DATA: Shut in car door

EXAM:
RIGHT THUMB 2+V

[finger ap]
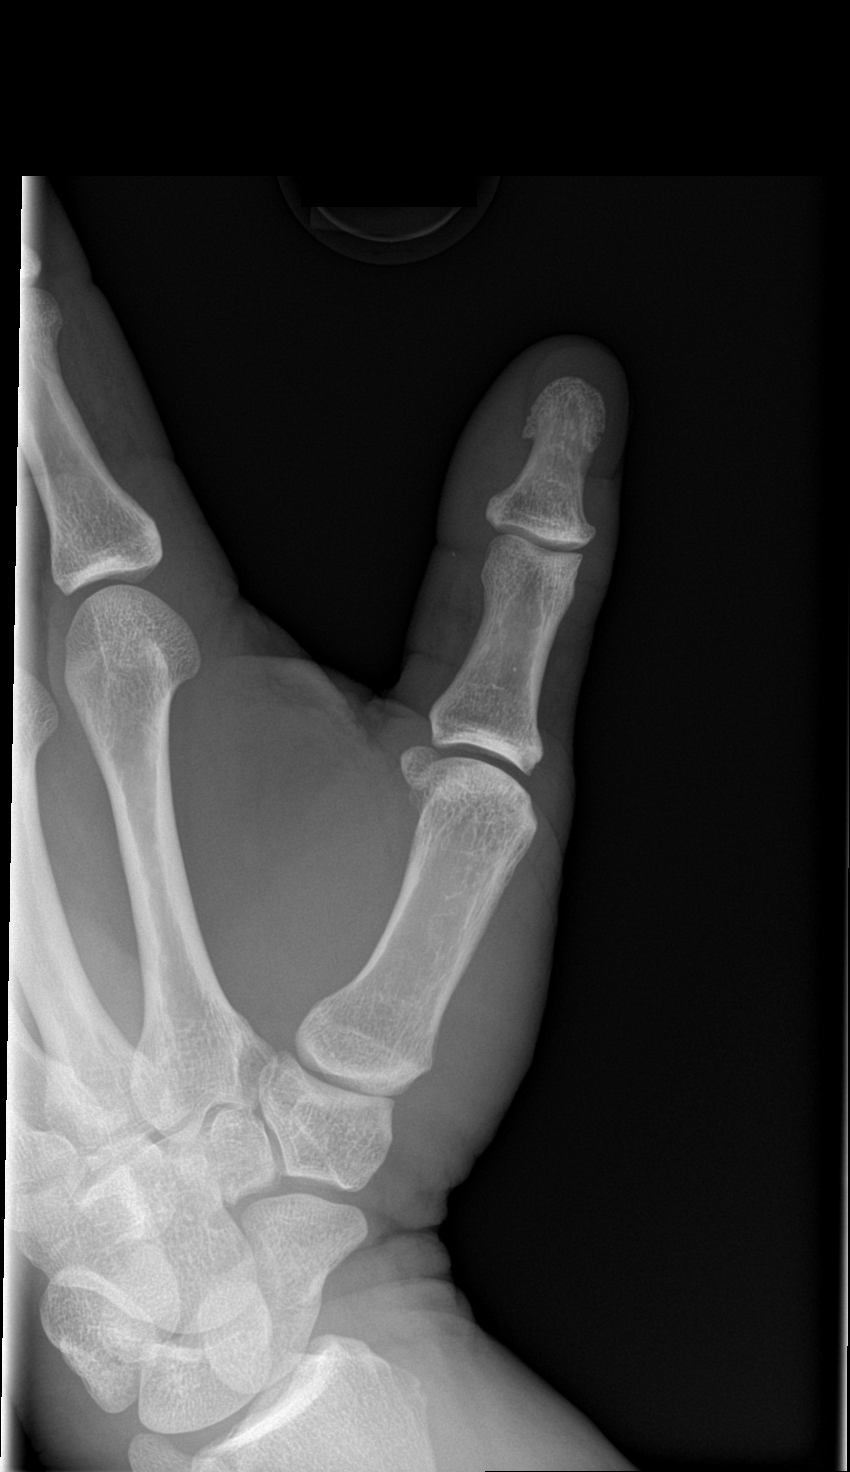

[finger obl]
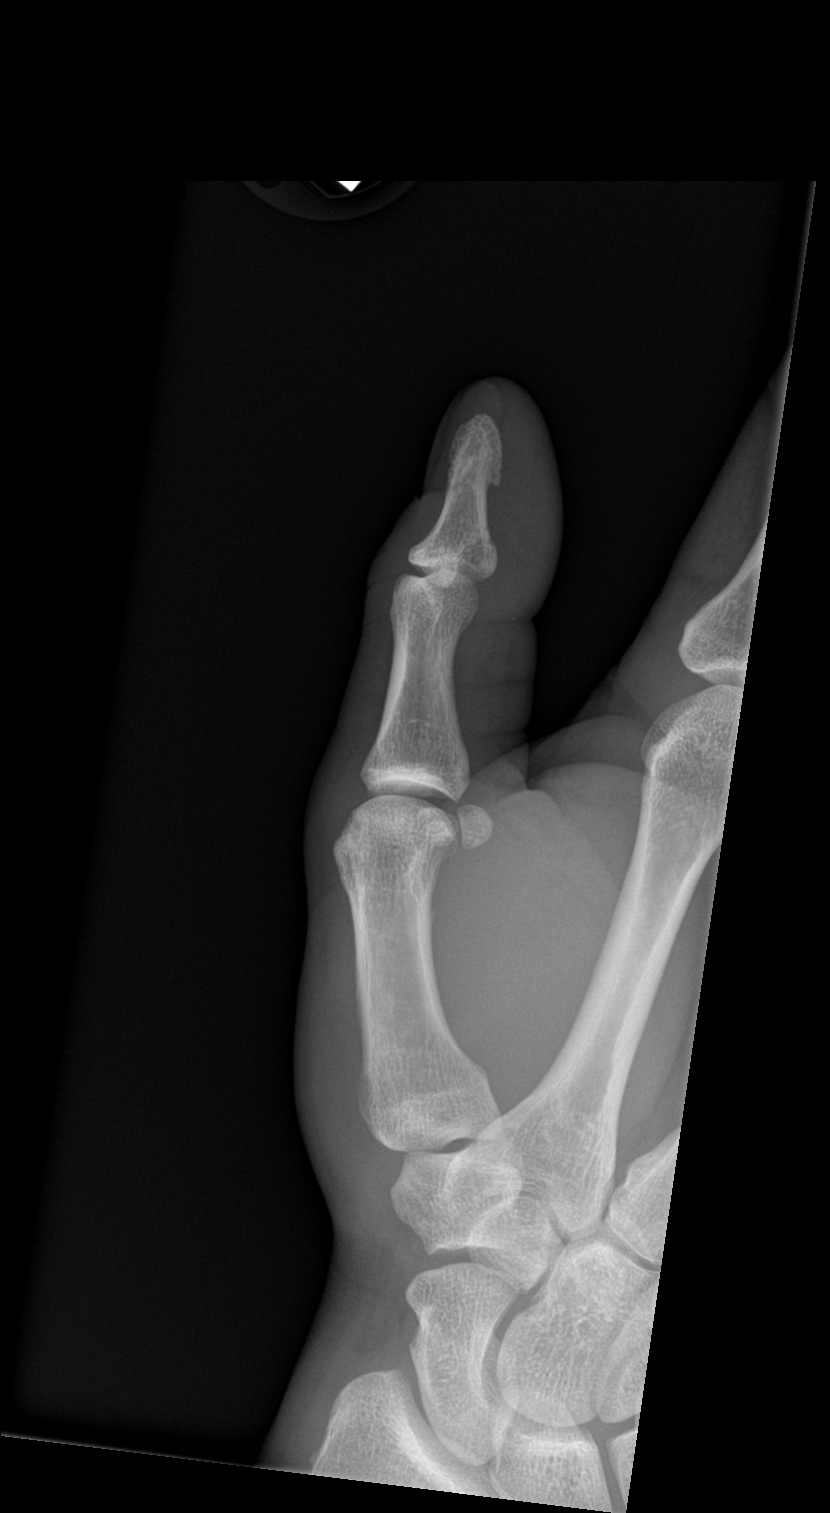

[finger lat]
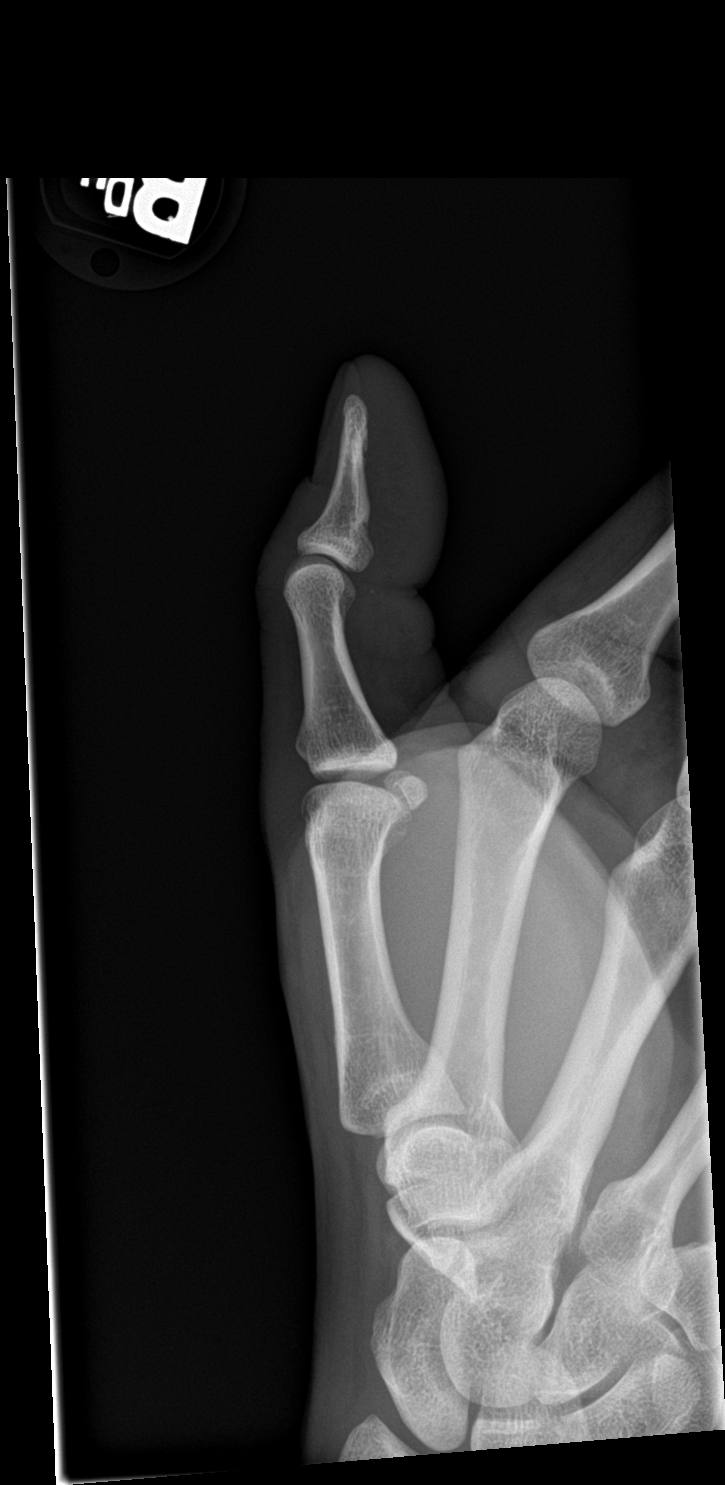

[3 of 3 positions shown; findings below may reference images not displayed]

FINDINGS: Conspicuous lucency likely reflecting a nondisplaced fracture
through the base of the first distal phalanx with intra-articular
extension. Punctate radiodensity seen in the volar soft tissues,
could reflect debris on the skin surface. Circumferential swelling
of the tip of the first digit. No other acute or worrisome osseous
abnormality.
IMPRESSION: Lucency through base of the first distal phalanx likely reflecting a
nondisplaced intra-articular fracture. Mild swelling of the first
digit.

Punctate radiodensity along the medial volar soft tissues of the
first digit, likely debris or punctate foreign body.

## 2023-03-10 ENCOUNTER — Other Ambulatory Visit: Payer: Self-pay

## 2023-03-10 ENCOUNTER — Encounter: Payer: Self-pay | Admitting: Pharmacist

## 2023-03-10 MED FILL — Amlodipine Besylate-Benazepril HCl Cap 10-40 MG: ORAL | 90 days supply | Qty: 90 | Fill #0 | Status: CN

## 2023-03-10 MED FILL — Amlodipine Besylate-Benazepril HCl Cap 5-20 MG: ORAL | 50 days supply | Qty: 100 | Fill #0 | Status: AC

## 2023-03-13 ENCOUNTER — Other Ambulatory Visit: Payer: Self-pay

## 2023-05-26 ENCOUNTER — Other Ambulatory Visit: Payer: Self-pay

## 2023-05-26 MED FILL — Amlodipine Besylate-Benazepril HCl Cap 5-20 MG: ORAL | 40 days supply | Qty: 80 | Fill #1 | Status: AC

## 2023-10-22 ENCOUNTER — Other Ambulatory Visit: Payer: Self-pay | Admitting: Primary Care

## 2023-10-22 ENCOUNTER — Other Ambulatory Visit: Payer: Self-pay

## 2023-10-22 DIAGNOSIS — I10 Essential (primary) hypertension: Secondary | ICD-10-CM

## 2023-10-23 DIAGNOSIS — E1165 Type 2 diabetes mellitus with hyperglycemia: Secondary | ICD-10-CM

## 2023-10-23 DIAGNOSIS — E785 Hyperlipidemia, unspecified: Secondary | ICD-10-CM

## 2023-10-23 DIAGNOSIS — I10 Essential (primary) hypertension: Secondary | ICD-10-CM

## 2023-10-26 ENCOUNTER — Other Ambulatory Visit: Payer: Self-pay

## 2023-10-26 MED ORDER — AMLODIPINE BESY-BENAZEPRIL HCL 5-20 MG PO CAPS
2.0000 | ORAL_CAPSULE | Freq: Every day | ORAL | 0 refills | Status: DC
  Filled 2023-10-26: qty 60, 30d supply, fill #0

## 2023-10-26 MED ORDER — ROSUVASTATIN CALCIUM 5 MG PO TABS
5.0000 mg | ORAL_TABLET | Freq: Every day | ORAL | 0 refills | Status: DC
  Filled 2023-10-26: qty 30, 30d supply, fill #0

## 2023-10-26 MED ORDER — GLIPIZIDE ER 10 MG PO TB24
10.0000 mg | ORAL_TABLET | Freq: Every day | ORAL | 0 refills | Status: DC
  Filled 2023-10-26: qty 30, 30d supply, fill #0

## 2023-10-27 ENCOUNTER — Other Ambulatory Visit: Payer: Self-pay

## 2024-01-03 ENCOUNTER — Other Ambulatory Visit: Payer: Self-pay

## 2024-01-03 ENCOUNTER — Ambulatory Visit: Payer: Self-pay | Admitting: Primary Care

## 2024-01-03 ENCOUNTER — Encounter: Payer: Self-pay | Admitting: Primary Care

## 2024-01-03 ENCOUNTER — Ambulatory Visit (INDEPENDENT_AMBULATORY_CARE_PROVIDER_SITE_OTHER): Payer: Self-pay | Admitting: Primary Care

## 2024-01-03 VITALS — BP 168/98 | HR 106 | Temp 98.0°F | Ht 74.0 in | Wt 223.0 lb

## 2024-01-03 DIAGNOSIS — Z23 Encounter for immunization: Secondary | ICD-10-CM | POA: Diagnosis not present

## 2024-01-03 DIAGNOSIS — E785 Hyperlipidemia, unspecified: Secondary | ICD-10-CM | POA: Diagnosis not present

## 2024-01-03 DIAGNOSIS — E1165 Type 2 diabetes mellitus with hyperglycemia: Secondary | ICD-10-CM

## 2024-01-03 DIAGNOSIS — Z794 Long term (current) use of insulin: Secondary | ICD-10-CM

## 2024-01-03 DIAGNOSIS — R41840 Attention and concentration deficit: Secondary | ICD-10-CM

## 2024-01-03 DIAGNOSIS — Z1211 Encounter for screening for malignant neoplasm of colon: Secondary | ICD-10-CM

## 2024-01-03 DIAGNOSIS — Z0001 Encounter for general adult medical examination with abnormal findings: Secondary | ICD-10-CM | POA: Diagnosis not present

## 2024-01-03 DIAGNOSIS — I10 Essential (primary) hypertension: Secondary | ICD-10-CM

## 2024-01-03 LAB — LIPID PANEL
Cholesterol: 179 mg/dL (ref 0–200)
HDL: 31.9 mg/dL — ABNORMAL LOW (ref >39.00)
LDL Cholesterol: 115 mg/dL — ABNORMAL HIGH (ref 0–99)
NonHDL: 147.35
Total CHOL/HDL Ratio: 6
Triglycerides: 161 mg/dL — ABNORMAL HIGH (ref 0.0–149.0)
VLDL: 32.2 mg/dL (ref 0.0–40.0)

## 2024-01-03 LAB — COMPREHENSIVE METABOLIC PANEL WITH GFR
ALT: 21 U/L (ref 0–53)
AST: 20 U/L (ref 0–37)
Albumin: 4.3 g/dL (ref 3.5–5.2)
Alkaline Phosphatase: 97 U/L (ref 39–117)
BUN: 18 mg/dL (ref 6–23)
CO2: 30 meq/L (ref 19–32)
Calcium: 9.7 mg/dL (ref 8.4–10.5)
Chloride: 95 meq/L — ABNORMAL LOW (ref 96–112)
Creatinine, Ser: 1.51 mg/dL — ABNORMAL HIGH (ref 0.40–1.50)
GFR: 55.4 mL/min — ABNORMAL LOW (ref >60.00)
Glucose, Bld: 476 mg/dL — ABNORMAL HIGH (ref 70–99)
Potassium: 4.7 meq/L (ref 3.5–5.1)
Sodium: 130 meq/L — ABNORMAL LOW (ref 135–145)
Total Bilirubin: 0.8 mg/dL (ref 0.2–1.2)
Total Protein: 7.2 g/dL (ref 6.0–8.3)

## 2024-01-03 LAB — POCT GLYCOSYLATED HEMOGLOBIN (HGB A1C): Hemoglobin A1C: 10.7 % — AB (ref 4.0–5.6)

## 2024-01-03 LAB — CBC
HCT: 43.1 % (ref 39.0–52.0)
Hemoglobin: 14.9 g/dL (ref 13.0–17.0)
MCHC: 34.5 g/dL (ref 30.0–36.0)
MCV: 87.9 fl (ref 78.0–100.0)
Platelets: 162 10*3/uL (ref 150.0–400.0)
RBC: 4.9 Mil/uL (ref 4.22–5.81)
RDW: 12.4 % (ref 11.5–15.5)
WBC: 8.8 10*3/uL (ref 4.0–10.5)

## 2024-01-03 LAB — MICROALBUMIN / CREATININE URINE RATIO
Creatinine,U: 65 mg/dL
Microalb Creat Ratio: 1006.7 mg/g — ABNORMAL HIGH (ref 0.0–30.0)
Microalb, Ur: 65.5 mg/dL — ABNORMAL HIGH (ref 0.0–1.9)

## 2024-01-03 MED ORDER — ROSUVASTATIN CALCIUM 5 MG PO TABS
5.0000 mg | ORAL_TABLET | Freq: Every day | ORAL | 3 refills | Status: AC
Start: 2024-01-03 — End: ?
  Filled 2024-01-03: qty 90, 90d supply, fill #0

## 2024-01-03 MED ORDER — PEN NEEDLES 31G X 6 MM MISC
3 refills | Status: AC
  Filled 2024-01-03: qty 100, 100d supply, fill #0

## 2024-01-03 MED ORDER — AMLODIPINE BESY-BENAZEPRIL HCL 10-40 MG PO CAPS
1.0000 | ORAL_CAPSULE | Freq: Every day | ORAL | 3 refills | Status: AC
  Filled 2024-01-03: qty 90, 90d supply, fill #0
  Filled 2024-08-20: qty 90, 90d supply, fill #1

## 2024-01-03 MED ORDER — INSULIN GLARGINE 100 UNIT/ML SOLOSTAR PEN
10.0000 [IU] | PEN_INJECTOR | Freq: Every day | SUBCUTANEOUS | 0 refills | Status: AC
  Filled 2024-01-03: qty 15, 150d supply, fill #0
  Filled 2024-01-04: qty 9, 90d supply, fill #0

## 2024-01-03 MED ORDER — FREESTYLE LIBRE 3 PLUS SENSOR MISC
1 refills | Status: AC
  Filled 2024-01-03 (×2): qty 2, 30d supply, fill #0
  Filled 2024-02-26 – 2024-04-03 (×2): qty 2, 30d supply, fill #1

## 2024-01-03 NOTE — Assessment & Plan Note (Addendum)
 Repeat lipid panel pending. Resume rosuvastatin  5 mg daily.

## 2024-01-03 NOTE — Assessment & Plan Note (Signed)
 Uncontrolled. Referral placed for formal testing.

## 2024-01-03 NOTE — Patient Instructions (Addendum)
 Start Lantus insulin for diabetes.  Inject 10 units into the skin twice daily.  You can increase by 2 units every 3 to 4 days for blood sugars consistently above 150.  Monitor your blood sugar with the freestyle libre 3+ sensor.  Please notify me if your stress does not cover.  Please notify me if the Lantus is not covered by your insurance.  Resume your amlodipine -benazepril  blood pressure pill.  Resume your rosuvastatin  cholesterol pill.  Stop by the lab prior to leaving today. I will notify you of your results once received.   Please update me regarding your blood sugar readings in 2 weeks via MyChart.  Diabetes Mellitus and Nutrition, Adult  When you have diabetes, or diabetes mellitus, it is very important to have healthy eating habits because your blood sugar (glucose) levels are greatly affected by what you eat and drink. Eating healthy foods in the right amounts, at about the same times every day, can help you: Manage your blood glucose. Lower your risk of heart disease. Improve your blood pressure. Reach or maintain a healthy weight. What can affect my meal plan? Every person with diabetes is different, and each person has different needs for a meal plan. Your health care provider may recommend that you work with a dietitian to make a meal plan that is best for you. Your meal plan may vary depending on factors such as: The calories you need. The medicines you take. Your weight. Your blood glucose, blood pressure, and cholesterol levels. Your activity level. Other health conditions you have, such as heart or kidney disease. How do carbohydrates affect me? Carbohydrates, also called carbs, affect your blood glucose level more than any other type of food. Eating carbs raises the amount of glucose in your blood. It is important to know how many carbs you can safely have in each meal. This is different for every person. Your dietitian can help you calculate how many carbs you  should have at each meal and for each snack. How does alcohol affect me? Alcohol can cause a decrease in blood glucose (hypoglycemia), especially if you use insulin or take certain diabetes medicines by mouth. Hypoglycemia can be a life-threatening condition. Symptoms of hypoglycemia, such as sleepiness, dizziness, and confusion, are similar to symptoms of having too much alcohol. Do not drink alcohol if: Your health care provider tells you not to drink. You are pregnant, may be pregnant, or are planning to become pregnant. If you drink alcohol: Limit how much you have to: 0-1 drink a day for women. 0-2 drinks a day for men. Know how much alcohol is in your drink. In the [place] one drink equals one 12 oz bottle of beer (355 mL), one 5 oz glass of wine (148 mL), or one 1 oz glass of hard liquor (44 mL). Keep yourself hydrated with water, diet soda, or unsweetened iced tea. Keep in mind that regular soda, juice, and other mixers may contain a lot of sugar and must be counted as carbs. What are tips for following this plan?  Reading food labels Start by checking the serving size on the Nutrition Facts label of packaged foods and drinks. The number of calories and the amount of carbs, fats, and other nutrients listed on the label are based on one serving of the item. Many items contain more than one serving per package. Check the total grams (g) of carbs in one serving. Check the number of grams of saturated fats and trans fats in one  serving. Choose foods that have a low amount or none of these fats. Check the number of milligrams (mg) of salt (sodium) in one serving. Most people should limit total sodium intake to less than 2,300 mg per day. Always check the nutrition information of foods labeled as "low-fat" or "nonfat." These foods may be higher in added sugar or refined carbs and should be avoided. Talk to your dietitian to identify your daily goals for nutrients listed on the  label. Shopping Avoid buying canned, pre-made, or processed foods. These foods tend to be high in fat, sodium, and added sugar. Shop around the outside edge of the grocery store. This is where you will most often find fresh fruits and vegetables, bulk grains, fresh meats, and fresh dairy products. Cooking Use low-heat cooking methods, such as baking, instead of high-heat cooking methods, such as deep frying. Cook using healthy oils, such as olive, canola, or sunflower oil. Avoid cooking with butter, cream, or high-fat meats. Meal planning Eat meals and snacks regularly, preferably at the same times every day. Avoid going long periods of time without eating. Eat foods that are high in fiber, such as fresh fruits, vegetables, beans, and whole grains. Eat 4-6 oz (112-168 g) of lean protein each day, such as lean meat, chicken, fish, eggs, or tofu. One ounce (oz) (28 g) of lean protein is equal to: 1 oz (28 g) of meat, chicken, or fish. 1 egg.  cup (62 g) of tofu. Eat some foods each day that contain healthy fats, such as avocado, nuts, seeds, and fish. What foods should I eat? Fruits Berries. Apples. Oranges. Peaches. Apricots. Plums. Grapes. Mangoes. Papayas. Pomegranates. Kiwi. Cherries. Vegetables Leafy greens, including lettuce, spinach, kale, chard, collard greens, mustard greens, and cabbage. Beets. Cauliflower. Broccoli. Carrots. Green beans. Tomatoes. Peppers. Onions. Cucumbers. Brussels sprouts. Grains Whole grains, such as whole-wheat or whole-grain bread, crackers, tortillas, cereal, and pasta. Unsweetened oatmeal. Quinoa. Brown or wild rice. Meats and other proteins Seafood. Poultry without skin. Lean cuts of poultry and beef. Tofu. Nuts. Seeds. Dairy Low-fat or fat-free dairy products such as milk, yogurt, and cheese. The items listed above may not be a complete list of foods and beverages you can eat and drink. Contact a dietitian for more information. What foods should I  avoid? Fruits Fruits canned with syrup. Vegetables Canned vegetables. Frozen vegetables with butter or cream sauce. Grains Refined white flour and flour products such as bread, pasta, snack foods, and cereals. Avoid all processed foods. Meats and other proteins Fatty cuts of meat. Poultry with skin. Breaded or fried meats. Processed meat. Avoid saturated fats. Dairy Full-fat yogurt, cheese, or milk. Beverages Sweetened drinks, such as soda or iced tea. The items listed above may not be a complete list of foods and beverages you should avoid. Contact a dietitian for more information. Questions to ask a health care provider Do I need to meet with a certified diabetes care and education specialist? Do I need to meet with a dietitian? What number can I call if I have questions? When are the best times to check my blood glucose? Where to find more information: American Diabetes Association: diabetes.org Academy of Nutrition and Dietetics: eatright.[employer] of Diabetes and Digestive and Kidney Diseases: StageSync.si Association of Diabetes Care & Education Specialists: diabeteseducator.org Summary It is important to have healthy eating habits because your blood sugar (glucose) levels are greatly affected by what you eat and drink. It is important to use alcohol carefully. A healthy meal plan  will help you manage your blood glucose and lower your risk of heart disease. Your health care provider may recommend that you work with a dietitian to make a meal plan that is best for you. This information is not intended to replace advice given to you by your health care provider. Make sure you discuss any questions you have with your health care provider. Document Revised: 02/18/2020 Document Reviewed: 02/19/2020 Elsevier Patient Education  2024 Elsevier Inc.  Please schedule a follow up visit for 3 months.

## 2024-01-03 NOTE — Assessment & Plan Note (Signed)
 Tetanus provided today Colonoscopy due, referral placed to GI today  Discussed the importance of a healthy diet and regular exercise in order for weight loss, and to reduce the risk of further co-morbidity.  Exam stable. Labs pending.  Follow up in 1 year for repeat physical.

## 2024-01-03 NOTE — Assessment & Plan Note (Signed)
 Uncontrolled, inconsistent compliance.  Discussed to set an alarm for reminder.   Resume amlodipine -benazepril  10-40 mg daily. CMP pending.

## 2024-01-03 NOTE — Assessment & Plan Note (Signed)
 No follow up in years. Diabetes is uncontrolled with A1c of 10.7 today.  We had a long discussion regarding medication compliance and the importance of follow-up. We also discussed the importance of a diabetes diet.  Remain off of glipizide  and metformin  due to side effects. Start Lantus insulin 10 units daily.  Increase by 2 units every 3-4 days for glucose readings consistently above 150.   Sample provided today for freestyle libre 3+ sensors.  Discussed instructions for use. Prescription for freestyle libre 3+ sensor sent to pharmacy.  Urine microalbumin due today.  Close follow-up in 3 months. He will update regarding blood sugars in 2 weeks

## 2024-01-03 NOTE — Progress Notes (Addendum)
 Subjective:    Patient ID: Maurice Henderson, male    DOB: [birth year], 48 y.o.   MRN: [id number]  HPI  Maurice Henderson is a very pleasant 48 y.o. male who presents today for complete physical and follow up of chronic conditions.  He is not consistently taking his medications as he forgets. He has not been taking glipizide  due to headaches. He cannot tolerate metformin  due to GI upset. He has not been in for follow up since 2023.  He would also like to discuss difficulty concentrating. Prior diagnosis of ADD as a child, was on treatment. No testing since childhood. He often loses focus, is forgetful to do things including taking medications. He would like treatment.  Immunizations: -Tetanus: Completed in >10 years ago. -Pneumonia: 2021  Diet: Fair diet.  Exercise: No regular exercise.  Eye exam: Completes annually  Dental exam: Completed years ago   Colonoscopy: Never completed     BP Readings from Last 3 Encounters:  01/03/24 (!) 168/98  09/21/21 (!) 170/102  01/15/21 (!) 164/100    Wt Readings from Last 3 Encounters:  01/03/24 223 lb (101.2 kg)  09/21/21 223 lb (101.2 kg)  01/15/21 207 lb (93.9 kg)      Review of Systems  Constitutional:  Negative for unexpected weight change.  HENT:  Negative for rhinorrhea.   Respiratory:  Negative for cough and shortness of breath.   Cardiovascular:  Negative for chest pain.  Gastrointestinal:  Negative for constipation and diarrhea.  Endocrine: Positive for polydipsia, polyphagia and polyuria.  Genitourinary:  Negative for difficulty urinating.  Musculoskeletal:  Negative for arthralgias and myalgias.  Skin:  Negative for rash.  Allergic/Immunologic: Negative for environmental allergies.  Neurological:  Positive for numbness and headaches. Negative for dizziness.  Psychiatric/Behavioral:  Positive for decreased concentration. The patient is nervous/anxious.          Past Medical History:  Diagnosis Date    Allergy    Arthritis    Asthma    during childhood   Hypertension     Social History   Socioeconomic History   Marital status: Married    Spouse name: Not on file   Number of children: Not on file   Years of education: Not on file   Highest education level: Not on file  Occupational History   Not on file  Tobacco Use   Smoking status: Never   Smokeless tobacco: Never  Vaping Use   Vaping status: Never Used  Substance and Sexual Activity   Alcohol use: Yes    Alcohol/week: 0.0 standard drinks of alcohol    Comment: rarely   Drug use: Not on file   Sexual activity: Not on file  Other Topics Concern   Not on file  Social History Narrative   In a relationship.   3 children.   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   Moved to [place] from [place].   Enjoys going to the shooting range, paintball.    Social Drivers of Corporate investment banker Strain: Not on file  Food Insecurity: Not on file  Transportation Needs: Not on file  Physical Activity: Not on file  Stress: Not on file  Social Connections: Unknown (12/14/2021)   Received from [facility], [facility]   Social Network    Social Network: Not on file  Intimate Partner Violence: Unknown (11/05/2021)   Received from [facility], [facility]   HITS    Physically Hurt: Not on file  Insult or Talk Down To: Not on file    Threaten Physical Harm: Not on file    Scream or Curse: Not on file    History reviewed. No pertinent surgical history.  Family History  Problem Relation Age of Onset   Arthritis Mother    Arthritis Father    Hyperlipidemia Father    Hypertension Father    Heart disease Father     Allergies  Allergen Reactions   Metformin  And Related Diarrhea    No current outpatient medications on file prior to visit.   No current facility-administered medications on file prior to visit.    BP (!) 168/98   Pulse (!) 106   Temp 98 F (36.7 C) (Temporal)   Ht 6\' 2"  (1.88 m)   Wt  223 lb (101.2 kg)   SpO2 98%   BMI 28.63 kg/m  Objective:   Physical Exam HENT:     Right Ear: Tympanic membrane and ear canal normal.     Left Ear: Tympanic membrane and ear canal normal.  Eyes:     Pupils: Pupils are equal, round, and reactive to light.  Cardiovascular:     Rate and Rhythm: Normal rate and regular rhythm.  Pulmonary:     Effort: Pulmonary effort is normal.     Breath sounds: Normal breath sounds.  Abdominal:     General: Bowel sounds are normal.     Palpations: Abdomen is soft.     Tenderness: There is no abdominal tenderness.  Musculoskeletal:        General: Normal range of motion.     Cervical back: Neck supple.  Skin:    General: Skin is warm and dry.  Neurological:     Mental Status: He is alert and oriented to person, place, and time.     Cranial Nerves: No cranial nerve deficit.     Deep Tendon Reflexes:     Reflex Scores:      Patellar reflexes are 2+ on the right side and 2+ on the left side. Psychiatric:        Mood and Affect: Mood normal.           Assessment & Plan:  Encounter for annual general medical examination with abnormal findings in adult Assessment & Plan: Tetanus provided today Colonoscopy due, referral placed to GI today  Discussed the importance of a healthy diet and regular exercise in order for weight loss, and to reduce the risk of further co-morbidity.  Exam stable. Labs pending.  Follow up in 1 year for repeat physical.    Type 2 diabetes mellitus with hyperglycemia, without long-term current use of insulin (HCC) Assessment & Plan: No follow up in years. Diabetes is uncontrolled with A1c of 10.7 today.  We had a long discussion regarding medication compliance and the importance of follow-up. We also discussed the importance of a diabetes diet.  Remain off of glipizide  and metformin  due to side effects. Start Lantus insulin 10 units daily.  Increase by 2 units every 3-4 days for glucose readings  consistently above 150.   Sample provided today for freestyle libre 3+ sensors.  Discussed instructions for use. Prescription for freestyle libre 3+ sensor sent to pharmacy.  Urine microalbumin due today.  Close follow-up in 3 months. He will update regarding blood sugars in 2 weeks  Orders: -     POCT glycosylated hemoglobin (Hb A1C) -     Insulin Glargine; Inject 10 Units into the skin daily. for diabetes.  Dispense: 15 mL; Refill: 0 -     FreeStyle Libre 3 Plus Sensor; Use to check blood sugar continuously. Change sensor every 15 days.  Dispense: 6 each; Refill: 1 -     Pen Needles; Use nightly with insulin.  Dispense: 100 each; Refill: 3 -     Microalbumin / creatinine urine ratio  Screening for colon cancer -     Ambulatory referral to Gastroenterology  Essential hypertension Assessment & Plan: Uncontrolled, inconsistent compliance.  Discussed to set an alarm for reminder.   Resume amlodipine -benazepril  10-40 mg daily. CMP pending.  Orders: -     CBC -     amLODIPine  Besy-Benazepril  HCl; Take 1 capsule by mouth daily. for blood pressure.  Dispense: 90 capsule; Refill: 3  Hyperlipidemia, unspecified hyperlipidemia type Assessment & Plan: Repeat lipid panel pending. Resume rosuvastatin  5 mg daily.  Orders: -     Lipid panel -     Comprehensive metabolic panel with GFR -     Rosuvastatin  Calcium ; Take 1 tablet (5 mg total) by mouth daily. For cholesterol.  Dispense: 90 tablet; Refill: 3  Encounter for immunization -     Tdap vaccine greater than or equal to 7yo IM  Difficulty concentrating Assessment & Plan: Uncontrolled. Referral placed for formal testing.  Orders: -     Ambulatory referral to Psychology        Gabriel John, NP

## 2024-01-03 NOTE — Addendum Note (Signed)
 Addended by: Taitum Alms K on: 01/03/2024 09:50 AM   Modules accepted: Orders, Level of Service

## 2024-01-04 ENCOUNTER — Other Ambulatory Visit: Payer: Self-pay

## 2024-02-26 ENCOUNTER — Other Ambulatory Visit: Payer: Self-pay

## 2024-04-03 ENCOUNTER — Other Ambulatory Visit: Payer: Self-pay

## 2024-04-04 ENCOUNTER — Ambulatory Visit: Admitting: Primary Care

## 2024-04-15 ENCOUNTER — Other Ambulatory Visit: Payer: Self-pay

## 2024-08-20 ENCOUNTER — Other Ambulatory Visit: Payer: Self-pay
